# Patient Record
Sex: Female | Born: 1960 | Race: White | Hispanic: No | Smoking: Current every day smoker
Health system: Southern US, Community
[De-identification: ages and names within clinical notes are randomized; demographics above are authoritative.]

## PROBLEM LIST (undated history)

## (undated) DIAGNOSIS — I2510 Atherosclerotic heart disease of native coronary artery without angina pectoris: Secondary | ICD-10-CM

## (undated) DIAGNOSIS — R630 Anorexia: Secondary | ICD-10-CM

## (undated) DIAGNOSIS — E559 Vitamin D deficiency, unspecified: Secondary | ICD-10-CM

## (undated) DIAGNOSIS — F17200 Nicotine dependence, unspecified, uncomplicated: Secondary | ICD-10-CM

## (undated) DIAGNOSIS — K3184 Gastroparesis: Secondary | ICD-10-CM

## (undated) DIAGNOSIS — M8580 Other specified disorders of bone density and structure, unspecified site: Secondary | ICD-10-CM

## (undated) DIAGNOSIS — G4700 Insomnia, unspecified: Secondary | ICD-10-CM

## (undated) DIAGNOSIS — M1990 Unspecified osteoarthritis, unspecified site: Secondary | ICD-10-CM

## (undated) DIAGNOSIS — R634 Abnormal weight loss: Secondary | ICD-10-CM

## (undated) DIAGNOSIS — G43909 Migraine, unspecified, not intractable, without status migrainosus: Secondary | ICD-10-CM

## (undated) DIAGNOSIS — Z780 Asymptomatic menopausal state: Secondary | ICD-10-CM

## (undated) HISTORY — DX: Nicotine dependence, unspecified, uncomplicated: F17.200

## (undated) HISTORY — DX: Abnormal weight loss: R63.4

## (undated) HISTORY — DX: Insomnia, unspecified: G47.00

## (undated) HISTORY — DX: Unspecified osteoarthritis, unspecified site: M19.90

## (undated) HISTORY — DX: Other specified disorders of bone density and structure, unspecified site: M85.80

## (undated) HISTORY — DX: Asymptomatic menopausal state: Z78.0

## (undated) HISTORY — DX: Migraine, unspecified, not intractable, without status migrainosus: G43.909

## (undated) HISTORY — DX: Vitamin D deficiency, unspecified: E55.9

## (undated) HISTORY — DX: Atherosclerotic heart disease of native coronary artery without angina pectoris: I25.10

## (undated) HISTORY — PX: ABDOMINAL HYSTERECTOMY: SHX81

## (undated) HISTORY — DX: Anorexia: R63.0

## (undated) HISTORY — DX: Gastroparesis: K31.84

---

## 2004-11-04 ENCOUNTER — Ambulatory Visit (HOSPITAL_COMMUNITY): Admission: RE | Admit: 2004-11-04 | Discharge: 2004-11-04 | Payer: Self-pay | Admitting: Family Medicine

## 2008-01-22 ENCOUNTER — Ambulatory Visit: Payer: Self-pay | Admitting: Cardiology

## 2009-02-08 HISTORY — PX: COLONOSCOPY: SHX174

## 2012-04-03 ENCOUNTER — Other Ambulatory Visit (HOSPITAL_COMMUNITY): Payer: Self-pay | Admitting: Family Medicine

## 2012-04-03 DIAGNOSIS — I739 Peripheral vascular disease, unspecified: Secondary | ICD-10-CM

## 2012-04-04 ENCOUNTER — Other Ambulatory Visit (HOSPITAL_COMMUNITY): Payer: Self-pay | Admitting: Family Medicine

## 2012-04-04 ENCOUNTER — Ambulatory Visit (HOSPITAL_COMMUNITY)
Admission: RE | Admit: 2012-04-04 | Discharge: 2012-04-04 | Disposition: A | Discharge status: Home / Self Care | Payer: Medicaid Other | Source: Ambulatory Visit | Attending: Family Medicine | Admitting: Family Medicine

## 2012-04-04 DIAGNOSIS — M79609 Pain in unspecified limb: Secondary | ICD-10-CM | POA: Insufficient documentation

## 2012-04-04 DIAGNOSIS — I739 Peripheral vascular disease, unspecified: Secondary | ICD-10-CM

## 2012-05-30 ENCOUNTER — Ambulatory Visit (INDEPENDENT_AMBULATORY_CARE_PROVIDER_SITE_OTHER): Payer: Medicaid Other | Admitting: Family Medicine

## 2012-05-30 ENCOUNTER — Encounter: Payer: Self-pay | Admitting: Family Medicine

## 2012-05-30 VITALS — BP 105/73 | HR 78 | Temp 97.3°F | Ht 59.0 in | Wt 113.4 lb

## 2012-05-30 DIAGNOSIS — M1710 Unilateral primary osteoarthritis, unspecified knee: Secondary | ICD-10-CM

## 2012-05-30 DIAGNOSIS — R062 Wheezing: Secondary | ICD-10-CM

## 2012-05-30 DIAGNOSIS — M170 Bilateral primary osteoarthritis of knee: Secondary | ICD-10-CM

## 2012-05-30 DIAGNOSIS — F17200 Nicotine dependence, unspecified, uncomplicated: Secondary | ICD-10-CM

## 2012-05-30 NOTE — Progress Notes (Signed)
Patient ID: Susan Fischer, female   DOB: 05/24/1960, 52 y.o.   MRN: [id number] SUBJECTIVE: HPI: Here for follow up of bronchitis and wheezing has not had shortness of breath has not had wheezing has had a response to medications Medications used:albuterol inhaler has not had swelling of the feet has not had chest pain has had exposure tobacco smoke or other triggers.continues to smoke. has had a flu shot has not had a pneumonia shot has not had fever has not had purulent phlegm has not had blood in the sputum, has not had weight loss has not had any recent medication changes. Arthritis of the knees has been better. Doesn't take anything for the knee pain. Cutting back at smoking but not ready to stop.   PMH/PSH: reviewed/updated in Epic  SH/FH: reviewed/updated in Epic  Allergies: reviewed/updated in Epic  Medications: reviewed/updated in Epic  Immunizations: reviewed/updated in Epic  ROS: As above in the HPI. All other systems are stable or negative.  OBJECTIVE: APPEARANCE:  Patient in no acute distress.The patient appeared well nourished and normally developed. Acyanotic. Waist: VITAL SIGNS:BP 105/73  Pulse 78  Temp(Src) 97.3 F (36.3 C) (Oral)  Ht 4\' 11"  (1.499 m)  Wt 113 lb 6.4 oz (51.438 kg)  BMI 22.89 kg/m2   SKIN: warm and  Dry without overt rashes, tattoos and scars  HEAD and Neck: without JVD, Head and scalp: normal Eyes:No scleral icterus. Fundi normal, eye movements normal. Ears: Auricle normal, canal normal, Tympanic membranes normal, insufflation normal. Nose: normal Throat: normal Neck & thyroid: normal  CHEST & LUNGS: Chest wall: normal Lungs: coarse breath sounds, scattered rhonchi. Rare wheeze heard CVS: Reveals the PMI to be normally located. Regular rhythm, First and Second Heart sounds are normal,  absence of murmurs, rubs or gallops. Peripheral vasculature: Radial pulses: normal Dorsal pedis pulses: normal Posterior pulses:  normal  ABDOMEN:  Appearance: normal Benign,, no organomegaly, no masses, no Abdominal Aortic enlargement. No Guarding , no rebound. No Bruits. Bowel sounds: normal  RECTAL: N/A GU: N/A  EXTREMETIES: nonedematous. Both Femoral and Pedal pulses are normal.  MUSCULOSKELETAL:  Spine: normal Joints: intact  NEUROLOGIC: oriented to time,place and person; nonfocal. ASSESSMENT: Wheezing  Tobacco use disorder  Arthritis of both knees   PLAN:  No orders of the defined types were placed in this encounter.   No results found for this or any previous visit (from the past 24 hour(s)). Meds ordered this encounter  Medications  . zonisamide (ZONEGRAN) 100 MG capsule    Sig: Take 100 mg by mouth daily. TAKE 300MG  AT BEDTIME  . albuterol (PROVENTIL HFA;VENTOLIN HFA) 108 (90 BASE) MCG/ACT inhaler    Sig: Inhale 2 puffs into the lungs every 6 (six) hours as needed for wheezing.  counselled on smoking cessation. OTC tylenol prn knee arthritis pain. Consider PFTs. Patient didn't have time today. Suspect COPD.  RTc in 2 months.  Cassidy Tabet P. [place] Charon, M.D.

## 2012-05-31 ENCOUNTER — Encounter: Payer: Self-pay | Admitting: *Deleted

## 2012-07-24 ENCOUNTER — Ambulatory Visit (INDEPENDENT_AMBULATORY_CARE_PROVIDER_SITE_OTHER): Payer: BC Managed Care – PPO | Admitting: Family Medicine

## 2012-07-24 ENCOUNTER — Encounter: Payer: Self-pay | Admitting: Family Medicine

## 2012-07-24 VITALS — BP 115/69 | HR 74 | Temp 98.1°F | Wt 112.0 lb

## 2012-07-24 DIAGNOSIS — F17200 Nicotine dependence, unspecified, uncomplicated: Secondary | ICD-10-CM

## 2012-07-24 DIAGNOSIS — R062 Wheezing: Secondary | ICD-10-CM

## 2012-07-24 MED ORDER — ZONISAMIDE 100 MG PO CAPS: 100.0000 mg | ORAL_CAPSULE | Freq: Every day | ORAL | Status: DC

## 2012-07-24 NOTE — Progress Notes (Signed)
Patient ID: Susan Fischer, female   DOB: [birth year], 52 y.o.   MRN: [id number] SUBJECTIVE: CC: Chief Complaint  Patient presents with  . Follow-up    2 month follow up    HPI: Here for follow up of her smoking coughing and  Wheezing. Has been doing great now. No complaints. Came for PFTs as was recommended. No hemoptysis, no chest pain , no SOB.  PMH/PSH: reviewed/updated in Epic  SH/FH: reviewed/updated in Epic  Allergies: reviewed/updated in Epic  Medications: reviewed/updated in Epic  Immunizations: reviewed/updated in Epic  ROS: As above in the HPI. All other systems are stable or negative.  OBJECTIVE: APPEARANCE:  Patient in no acute distress.The patient appeared well nourished and normally developed. Acyanotic. Waist: VITAL SIGNS:BP 115/69  Pulse 74  Temp(Src) 98.1 F (36.7 C) (Oral)  Wt 112 lb (50.803 kg)  BMI 22.61 kg/m2 WF loolks well today  SKIN: warm and  Dry without overt rashes, tattoos no change in scars  HEAD and Neck: without JVD, Head and scalp: normal Eyes:No scleral icterus. Fundi normal, eye movements normal. Ears: Auricle normal, canal normal, Tympanic membranes normal, insufflation normal. Nose: normal Throat: normal Neck & thyroid: normal  CHEST & LUNGS: Chest wall: normal Lungs: coarse bs. No wheezes , no rales , no rhonchi. CVS: Reveals the PMI to be normally located. Regular rhythm, First and Second Heart sounds are normal,  absence of murmurs, rubs or gallops. Peripheral vasculature: Radial pulses: normal Dorsal pedis pulses: normal Posterior pulses: normal  ABDOMEN:  Appearance: normal Benign, no organomegaly, no masses, no Abdominal Aortic enlargement. No Guarding , no rebound. No Bruits. Bowel sounds: normal  RECTAL: N/A GU: N/A  EXTREMETIES: nonedematous. Both Femoral and Pedal pulses are normal.  MUSCULOSKELETAL:  Spine: normal Joints: crepitus of knees.  NEUROLOGIC: oriented to time,place and person;  nonfocal. Strength is normal Sensory is normal Reflexes are normal Cranial Nerves are normal.  ASSESSMENT: Wheezing  Tobacco use disorder   PLAN: PFTs reviewed scanned to chart, acceptable counselled on smoking ceassation. Reviewed PFTs with patienyt  .Return in about 4 months (around 11/23/2012) for Recheck medical problems.  Jlyn Cerros P. [place] Charon, M.D.

## 2012-07-25 DIAGNOSIS — R062 Wheezing: Secondary | ICD-10-CM | POA: Insufficient documentation

## 2012-07-25 DIAGNOSIS — F17200 Nicotine dependence, unspecified, uncomplicated: Secondary | ICD-10-CM | POA: Insufficient documentation

## 2012-08-01 ENCOUNTER — Ambulatory Visit: Payer: Medicaid Other | Admitting: Family Medicine

## 2012-11-24 ENCOUNTER — Ambulatory Visit (INDEPENDENT_AMBULATORY_CARE_PROVIDER_SITE_OTHER): Payer: BC Managed Care – PPO | Admitting: Family Medicine

## 2012-11-24 ENCOUNTER — Ambulatory Visit (INDEPENDENT_AMBULATORY_CARE_PROVIDER_SITE_OTHER): Payer: BC Managed Care – PPO

## 2012-11-24 ENCOUNTER — Encounter: Payer: Self-pay | Admitting: Family Medicine

## 2012-11-24 VITALS — BP 107/67 | HR 77 | Temp 97.8°F | Ht 61.0 in | Wt 102.4 lb

## 2012-11-24 DIAGNOSIS — Z780 Asymptomatic menopausal state: Secondary | ICD-10-CM | POA: Insufficient documentation

## 2012-11-24 DIAGNOSIS — G43909 Migraine, unspecified, not intractable, without status migrainosus: Secondary | ICD-10-CM | POA: Insufficient documentation

## 2012-11-24 DIAGNOSIS — M85851 Other specified disorders of bone density and structure, right thigh: Secondary | ICD-10-CM | POA: Insufficient documentation

## 2012-11-24 DIAGNOSIS — Z23 Encounter for immunization: Secondary | ICD-10-CM

## 2012-11-24 DIAGNOSIS — R634 Abnormal weight loss: Secondary | ICD-10-CM

## 2012-11-24 DIAGNOSIS — M1990 Unspecified osteoarthritis, unspecified site: Secondary | ICD-10-CM | POA: Insufficient documentation

## 2012-11-24 DIAGNOSIS — R059 Cough, unspecified: Secondary | ICD-10-CM

## 2012-11-24 DIAGNOSIS — M8580 Other specified disorders of bone density and structure, unspecified site: Secondary | ICD-10-CM

## 2012-11-24 DIAGNOSIS — M899 Disorder of bone, unspecified: Secondary | ICD-10-CM

## 2012-11-24 DIAGNOSIS — F17200 Nicotine dependence, unspecified, uncomplicated: Secondary | ICD-10-CM

## 2012-11-24 DIAGNOSIS — M129 Arthropathy, unspecified: Secondary | ICD-10-CM

## 2012-11-24 DIAGNOSIS — R05 Cough: Secondary | ICD-10-CM

## 2012-11-24 DIAGNOSIS — Z13220 Encounter for screening for lipoid disorders: Secondary | ICD-10-CM

## 2012-11-24 LAB — POCT CBC
Granulocyte percent: 79 %G (ref 37–80)
HCT, POC: 39.3 % (ref 37.7–47.9)
Hemoglobin: 13.5 g/dL (ref 12.2–16.2)
Lymph, poc: 1 (ref 0.6–3.4)
MCH, POC: 30.4 pg (ref 27–31.2)
MCHC: 34.4 g/dL (ref 31.8–35.4)
MCV: 88.5 fL (ref 80–97)
MPV: 8.6 fL (ref 0–99.8)
POC Granulocyte: 4.7 (ref 2–6.9)
POC LYMPH PERCENT: 16.7 %L (ref 10–50)
Platelet Count, POC: 179 10*3/uL (ref 142–424)
RBC: 4.4 M/uL (ref 4.04–5.48)
RDW, POC: 13.1 %
WBC: 6 10*3/uL (ref 4.6–10.2)

## 2012-11-24 NOTE — Patient Instructions (Signed)
Smoking Cessation Quitting smoking is important to your health and has many advantages. However, it is not always easy to quit since nicotine is a very addictive drug. Often times, people try 3 times or more before being able to quit. This document explains the best ways for you to prepare to quit smoking. Quitting takes hard work and a lot of effort, but you can do it. ADVANTAGES OF QUITTING SMOKING  You will live longer, feel better, and live better.  Your body will feel the impact of quitting smoking almost immediately.  Within 20 minutes, blood pressure decreases. Your pulse returns to its normal level.  After 8 hours, carbon monoxide levels in the blood return to normal. Your oxygen level increases.  After 24 hours, the chance of having a heart attack starts to decrease. Your breath, hair, and body stop smelling like smoke.  After 48 hours, damaged nerve endings begin to recover. Your sense of taste and smell improve.  After 72 hours, the body is virtually free of nicotine. Your bronchial tubes relax and breathing becomes easier.  After 2 to 12 weeks, lungs can hold more air. Exercise becomes easier and circulation improves.  The risk of having a heart attack, stroke, cancer, or lung disease is greatly reduced.  After 1 year, the risk of coronary heart disease is cut in half.  After 5 years, the risk of stroke falls to the same as a nonsmoker.  After 10 years, the risk of lung cancer is cut in half and the risk of other cancers decreases significantly.  After 15 years, the risk of coronary heart disease drops, usually to the level of a nonsmoker.  If you are pregnant, quitting smoking will improve your chances of having a healthy baby.  The people you live with, especially any children, will be healthier.  You will have extra money to spend on things other than cigarettes. QUESTIONS TO THINK ABOUT BEFORE ATTEMPTING TO QUIT You may want to talk about your answers with your  caregiver.  Why do you want to quit?  If you tried to quit in the past, what helped and what did not?  What will be the most difficult situations for you after you quit? How will you plan to handle them?  Who can help you through the tough times? Your family? Friends? A caregiver?  What pleasures do you get from smoking? What ways can you still get pleasure if you quit? Here are some questions to ask your caregiver:  How can you help me to be successful at quitting?  What medicine do you think would be best for me and how should I take it?  What should I do if I need more help?  What is smoking withdrawal like? How can I get information on withdrawal? GET READY  Set a quit date.  Change your environment by getting rid of all cigarettes, ashtrays, matches, and lighters in your home, car, or work. Do not let people smoke in your home.  Review your past attempts to quit. Think about what worked and what did not. GET SUPPORT AND ENCOURAGEMENT You have a better chance of being successful if you have help. You can get support in many ways.  Tell your family, friends, and co-workers that you are going to quit and need their support. Ask them not to smoke around you.  Get individual, group, or telephone counseling and support. Programs are available at local hospitals and health centers. Call your local health department for   information about programs in your area.  Spiritual beliefs and practices may help some smokers quit.  Download a "quit meter" on your computer to keep track of quit statistics, such as how long you have gone without smoking, cigarettes not smoked, and money saved.  Get a self-help book about quitting smoking and staying off of tobacco. LEARN NEW SKILLS AND BEHAVIORS  Distract yourself from urges to smoke. Talk to someone, go for a walk, or occupy your time with a task.  Change your normal routine. Take a different route to work. Drink tea instead of coffee.  Eat breakfast in a different place.  Reduce your stress. Take a hot bath, exercise, or read a book.  Plan something enjoyable to do every day. Reward yourself for not smoking.  Explore interactive web-based programs that specialize in helping you quit. GET MEDICINE AND USE IT CORRECTLY Medicines can help you stop smoking and decrease the urge to smoke. Combining medicine with the above behavioral methods and support can greatly increase your chances of successfully quitting smoking.  Nicotine replacement therapy helps deliver nicotine to your body without the negative effects and risks of smoking. Nicotine replacement therapy includes nicotine gum, lozenges, inhalers, nasal sprays, and skin patches. Some may be available over-the-counter and others require a prescription.  Antidepressant medicine helps people abstain from smoking, but how this works is unknown. This medicine is available by prescription.  Nicotinic receptor partial agonist medicine simulates the effect of nicotine in your brain. This medicine is available by prescription. Ask your caregiver for advice about which medicines to use and how to use them based on your health history. Your caregiver will tell you what side effects to look out for if you choose to be on a medicine or therapy. Carefully read the information on the package. Do not use any other product containing nicotine while using a nicotine replacement product.  RELAPSE OR DIFFICULT SITUATIONS Most relapses occur within the first 3 months after quitting. Do not be discouraged if you start smoking again. Remember, most people try several times before finally quitting. You may have symptoms of withdrawal because your body is used to nicotine. You may crave cigarettes, be irritable, feel very hungry, cough often, get headaches, or have difficulty concentrating. The withdrawal symptoms are only temporary. They are strongest when you first quit, but they will go away within  10 14 days. To reduce the chances of relapse, try to:  Avoid drinking alcohol. Drinking lowers your chances of successfully quitting.  Reduce the amount of caffeine you consume. Once you quit smoking, the amount of caffeine in your body increases and can give you symptoms, such as a rapid heartbeat, sweating, and anxiety.  Avoid smokers because they can make you want to smoke.  Do not let weight gain distract you. Many smokers will gain weight when they quit, usually less than 10 pounds. Eat a healthy diet and stay active. You can always lose the weight gained after you quit.  Find ways to improve your mood other than smoking. FOR MORE INFORMATION  www.smokefree.gov  Document Released: 01/19/2001 Document Revised: 07/27/2011 Document Reviewed: 05/06/2011 [employer] Patient Information 2014 [employer].  
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
## 2012-11-25 LAB — THYROID PANEL WITH TSH
Free Thyroxine Index: 1.5 (ref 1.2–4.9)
T3 Uptake Ratio: 26 % (ref 24–39)
T4, Total: 5.9 ug/dL (ref 4.5–12.0)
TSH: 2.91 u[IU]/mL (ref 0.450–4.500)

## 2012-11-25 LAB — CMP14+EGFR
ALT: 8 IU/L (ref 0–32)
AST: 14 IU/L (ref 0–40)
Albumin/Globulin Ratio: 2.4 (ref 1.1–2.5)
Albumin: 4.3 g/dL (ref 3.5–5.5)
Alkaline Phosphatase: 70 IU/L (ref 39–117)
BUN/Creatinine Ratio: 17 (ref 9–23)
BUN: 13 mg/dL (ref 6–24)
CO2: 21 mmol/L (ref 18–29)
Calcium: 9.3 mg/dL (ref 8.7–10.2)
Chloride: 102 mmol/L (ref 97–108)
Creatinine, Ser: 0.77 mg/dL (ref 0.57–1.00)
GFR calc Af Amer: 101 mL/min/{1.73_m2} (ref >59)
GFR calc non Af Amer: 88 mL/min/{1.73_m2} (ref >59)
Globulin, Total: 1.8 g/dL (ref 1.5–4.5)
Glucose: 81 mg/dL (ref 65–99)
Potassium: 4.3 mmol/L (ref 3.5–5.2)
Sodium: 143 mmol/L (ref 134–144)
Total Bilirubin: 0.2 mg/dL (ref 0.0–1.2)
Total Protein: 6.1 g/dL (ref 6.0–8.5)

## 2012-11-25 LAB — LIPID PANEL
Chol/HDL Ratio: 3 ratio units (ref 0.0–4.4)
Cholesterol, Total: 163 mg/dL (ref 100–199)
HDL: 54 mg/dL (ref >39)
LDL Calculated: 91 mg/dL (ref 0–99)
Triglycerides: 89 mg/dL (ref 0–149)
VLDL Cholesterol Cal: 18 mg/dL (ref 5–40)

## 2012-11-25 LAB — LIPASE: Lipase: 21 U/L (ref 0–59)

## 2012-11-25 LAB — AMYLASE: Amylase: 63 U/L (ref 31–124)

## 2012-11-25 NOTE — Progress Notes (Signed)
Quick Note:  Call patient. Labs normal. We did not get a Urine sample to check. She left and forgot to give [place] the urine. Otherwise, no change in plan. ______

## 2012-12-11 ENCOUNTER — Encounter: Payer: Self-pay | Admitting: Family Medicine

## 2012-12-11 ENCOUNTER — Ambulatory Visit (INDEPENDENT_AMBULATORY_CARE_PROVIDER_SITE_OTHER): Payer: BC Managed Care – PPO | Admitting: Family Medicine

## 2012-12-11 VITALS — BP 113/70 | HR 82 | Temp 97.0°F | Ht 63.0 in | Wt 103.6 lb

## 2012-12-11 DIAGNOSIS — M899 Disorder of bone, unspecified: Secondary | ICD-10-CM

## 2012-12-11 DIAGNOSIS — R634 Abnormal weight loss: Secondary | ICD-10-CM

## 2012-12-11 DIAGNOSIS — M8580 Other specified disorders of bone density and structure, unspecified site: Secondary | ICD-10-CM

## 2012-12-11 DIAGNOSIS — G43909 Migraine, unspecified, not intractable, without status migrainosus: Secondary | ICD-10-CM

## 2012-12-11 DIAGNOSIS — R630 Anorexia: Secondary | ICD-10-CM | POA: Insufficient documentation

## 2012-12-11 DIAGNOSIS — F17200 Nicotine dependence, unspecified, uncomplicated: Secondary | ICD-10-CM

## 2012-12-11 DIAGNOSIS — R300 Dysuria: Secondary | ICD-10-CM

## 2012-12-11 DIAGNOSIS — Z780 Asymptomatic menopausal state: Secondary | ICD-10-CM

## 2012-12-11 HISTORY — DX: Anorexia: R63.0

## 2012-12-11 LAB — POCT URINALYSIS DIPSTICK
Bilirubin, UA: NEGATIVE
Glucose, UA: NEGATIVE
Ketones, UA: NEGATIVE
Leukocytes, UA: NEGATIVE
Nitrite, UA: NEGATIVE
Protein, UA: NEGATIVE
Spec Grav, UA: 1.02
Urobilinogen, UA: NEGATIVE
pH, UA: 5

## 2012-12-11 LAB — POCT UA - MICROSCOPIC ONLY
Bacteria, U Microscopic: NEGATIVE
Casts, Ur, LPF, POC: NEGATIVE
Crystals, Ur, HPF, POC: NEGATIVE
Yeast, UA: NEGATIVE

## 2012-12-11 NOTE — Progress Notes (Signed)
SUBJECTIVE: CC: Chief Complaint  Patient presents with  . Follow-up    2 week follow up weight loss     HPI: Still losing weight. The appearance of weight gain today is not correct per patient. The last times  She had her shoes off and today has the on. And appears she gained a pound. Actually lost 2 pounds per patient. Has poor appetite. Denies depressive symptoms, see the depression module. No cough ., no night sweats,no melena. No pains. Feels fine. Her clothes is just falling off of her.  Past Medical History  Diagnosis Date  . Migraine   . Osteopenia   . Vitamin D deficiency   . Postmenopausal   . Tobacco use disorder   . Arthritis    No past surgical history on file. History   Social History  . Marital Status: Legally Separated    Spouse Name: N/A    Number of Children: N/A  . Years of Education: N/A   Occupational History  . Not on file.   Social History Main Topics  . Smoking status: Current Every Day Smoker -- 0.50 packs/day    Types: Cigarettes  . Smokeless tobacco: Not on file  . Alcohol Use: No  . Drug Use: No  . Sexual Activity: Not on file   Other Topics Concern  . Not on file   Social History Narrative  . No narrative on file   Family History  Problem Relation Age of Onset  . Cancer Father   . Heart attack Brother 88   Current Outpatient Prescriptions on File Prior to Visit  Medication Sig Dispense Refill  . albuterol (PROVENTIL HFA;VENTOLIN HFA) 108 (90 BASE) MCG/ACT inhaler Inhale 2 puffs into the lungs every 6 (six) hours as needed for wheezing.      Marland Kitchen zonisamide (ZONEGRAN) 100 MG capsule Take 1 capsule (100 mg total) by mouth daily. TAKE 300MG  AT BEDTIME  90 capsule  5   No current facility-administered medications on file prior to visit.   Allergies  Allergen Reactions  . Codeine   . Hydrocodone   . Robitussin Cough-Cold D [Pseudoephedrine-Dm-Gg]    Immunization History  Administered Date(s) Administered  . Influenza,inj,Quad  PF,36+ Mos 11/24/2012  . Tdap 12/10/2010   Prior to Admission medications   Medication Sig Start Date End Date Taking? Authorizing Provider  albuterol (PROVENTIL HFA;VENTOLIN HFA) 108 (90 BASE) MCG/ACT inhaler Inhale 2 puffs into the lungs every 6 (six) hours as needed for wheezing.   Yes Historical Provider, MD  zonisamide (ZONEGRAN) 100 MG capsule Take 1 capsule (100 mg total) by mouth daily. TAKE 300MG  AT BEDTIME 07/24/12  Yes Ileana Ladd, MD     ROS: As above in the HPI. All other systems are stable or negative.  OBJECTIVE: APPEARANCE:  Patient in no acute distress.The patient appeared well nourished and normally developed. Acyanotic. Waist: VITAL SIGNS:BP 113/70  Pulse 82  Temp(Src) 97 F (36.1 C) (Oral)  Ht 5\' 3"  (1.6 m)  Wt 103 lb 9.6 oz (46.993 kg)  BMI 18.36 kg/m2 WF with weight loss. Her pants waist is definitely larger.   SKIN: warm and  Dry without overt rashes, tattoos and scars  HEAD and Neck: without JVD, Head and scalp: normal Eyes:No scleral icterus. Fundi normal, eye movements normal. Ears: Auricle normal, canal normal, Tympanic membranes normal, insufflation normal. Nose: normal Throat: normal Neck & thyroid: normal  CHEST & LUNGS: Chest wall: normal Lungs: Clear  CVS: Reveals the PMI to be normally located.  Regular rhythm, First and Second Heart sounds are normal,  absence of murmurs, rubs or gallops. Peripheral vasculature: Radial pulses: normal Dorsal pedis pulses: normal Posterior pulses: normal  ABDOMEN:  Appearance: normal Benign, no organomegaly, no masses, no Abdominal Aortic enlargement. No Guarding , no rebound. No Bruits. Bowel sounds: normal  RECTAL: N/A GU: N/A  EXTREMETIES: nonedematous.  MUSCULOSKELETAL:  Spine: normal Joints: intact  NEUROLOGIC: oriented to time,place and person; nonfocal. Strength is normal Sensory is normal Reflexes are normal Cranial Nerves are normal.  Reviewed Chest Xray recently  negative.  ASSESSMENT: Dysuria - Plan: POCT urinalysis dipstick, POCT UA - Microscopic Only, Urine culture  Tobacco use disorder  Osteopenia  Postmenopausal - Plan: MM Digital Screening  Migraine  Weight loss - Plan: CT Abdomen Pelvis W Contrast, Ambulatory referral to Gastroenterology, Urine culture  Anorexia - Plan: CT Abdomen Pelvis W Contrast, Ambulatory referral to Gastroenterology  PLAN: Will embark on further work up to include GI consult even though she has had colonoscopy. Sh emay need EGD. Will order a Ct abdomen to r/o occult intraabdominal etiology.  Results for orders placed in visit on 12/11/12  POCT URINALYSIS DIPSTICK      Result Value Range   Color, UA gold     Clarity, UA clear     Glucose, UA negative     Bilirubin, UA negative     Ketones, UA negative     Spec Grav, UA 1.020     Blood, UA trace     pH, UA 5.0     Protein, UA negative     Urobilinogen, UA negative     Nitrite, UA negative     Leukocytes, UA Negative    POCT UA - MICROSCOPIC ONLY      Result Value Range   WBC, Ur, HPF, POC occ     RBC, urine, microscopic 1-3     Bacteria, U Microscopic negative     Mucus, UA few     Epithelial cells, urine per micros occ     Crystals, Ur, HPF, POC negative     Casts, Ur, LPF, POC negative     Yeast, UA negative      Orders Placed This Encounter  Procedures  . Urine culture  . MM Digital Screening    Standing Status: Future     Number of Occurrences:      Standing Expiration Date: 02/10/2014    Order Specific Question:  Reason for Exam (SYMPTOM  OR DIAGNOSIS REQUIRED)    Answer:  routine screen    Order Specific Question:  Is the patient pregnant?    Answer:  No    Order Specific Question:  Preferred imaging location?    Answer:  [facility]  . CT Abdomen Pelvis W Contrast    Standing Status: Future     Number of Occurrences:      Standing Expiration Date: 03/13/2014    Order Specific Question:  Reason for Exam (SYMPTOM  OR DIAGNOSIS  REQUIRED)    Answer:  unexplained weight loss and anorexia    Order Specific Question:  Is the patient pregnant?    Answer:  No    Order Specific Question:  Preferred imaging location?    Answer:  [facility]  . Ambulatory referral to Gastroenterology    Referral Priority:  Routine    Referral Type:  Consultation    Referral Reason:  Specialty Services Required    Requested Specialty:  Gastroenterology    Number of Visits  Requested:  1  . POCT urinalysis dipstick  . POCT UA - Microscopic Only  smoking cessation counselling.handout in AVS. 10 minutes counseling. Praised patient on her tobacco use reduction and hope she will achieve total cessation.   No orders of the defined types were placed in this encounter.   There are no discontinued medications. Return in about 4 weeks (around 01/08/2013) for Recheck medical problems.  Tosh Glaze P. Modesto Charon, M.D.

## 2012-12-11 NOTE — Patient Instructions (Signed)
Smoking Cessation Quitting smoking is important to your health and has many advantages. However, it is not always easy to quit since nicotine is a very addictive drug. Often times, people try 3 times or more before being able to quit. This document explains the best ways for you to prepare to quit smoking. Quitting takes hard work and a lot of effort, but you can do it. ADVANTAGES OF QUITTING SMOKING  You will live longer, feel better, and live better.  Your body will feel the impact of quitting smoking almost immediately.  Within 20 minutes, blood pressure decreases. Your pulse returns to its normal level.  After 8 hours, carbon monoxide levels in the blood return to normal. Your oxygen level increases.  After 24 hours, the chance of having a heart attack starts to decrease. Your breath, hair, and body stop smelling like smoke.  After 48 hours, damaged nerve endings begin to recover. Your sense of taste and smell improve.  After 72 hours, the body is virtually free of nicotine. Your bronchial tubes relax and breathing becomes easier.  After 2 to 12 weeks, lungs can hold more air. Exercise becomes easier and circulation improves.  The risk of having a heart attack, stroke, cancer, or lung disease is greatly reduced.  After 1 year, the risk of coronary heart disease is cut in half.  After 5 years, the risk of stroke falls to the same as a nonsmoker.  After 10 years, the risk of lung cancer is cut in half and the risk of other cancers decreases significantly.  After 15 years, the risk of coronary heart disease drops, usually to the level of a nonsmoker.  If you are pregnant, quitting smoking will improve your chances of having a healthy baby.  The people you live with, especially any children, will be healthier.  You will have extra money to spend on things other than cigarettes. QUESTIONS TO THINK ABOUT BEFORE ATTEMPTING TO QUIT You may want to talk about your answers with your  caregiver.  Why do you want to quit?  If you tried to quit in the past, what helped and what did not?  What will be the most difficult situations for you after you quit? How will you plan to handle them?  Who can help you through the tough times? Your family? Friends? A caregiver?  What pleasures do you get from smoking? What ways can you still get pleasure if you quit? Here are some questions to ask your caregiver:  How can you help me to be successful at quitting?  What medicine do you think would be best for me and how should I take it?  What should I do if I need more help?  What is smoking withdrawal like? How can I get information on withdrawal? GET READY  Set a quit date.  Change your environment by getting rid of all cigarettes, ashtrays, matches, and lighters in your home, car, or work. Do not let people smoke in your home.  Review your past attempts to quit. Think about what worked and what did not. GET SUPPORT AND ENCOURAGEMENT You have a better chance of being successful if you have help. You can get support in many ways.  Tell your family, friends, and co-workers that you are going to quit and need their support. Ask them not to smoke around you.  Get individual, group, or telephone counseling and support. Programs are available at local hospitals and health centers. Call your local health department for   information about programs in your area.  Spiritual beliefs and practices may help some smokers quit.  Download a "quit meter" on your computer to keep track of quit statistics, such as how long you have gone without smoking, cigarettes not smoked, and money saved.  Get a self-help book about quitting smoking and staying off of tobacco. LEARN NEW SKILLS AND BEHAVIORS  Distract yourself from urges to smoke. Talk to someone, go for a walk, or occupy your time with a task.  Change your normal routine. Take a different route to work. Drink tea instead of coffee.  Eat breakfast in a different place.  Reduce your stress. Take a hot bath, exercise, or read a book.  Plan something enjoyable to do every day. Reward yourself for not smoking.  Explore interactive web-based programs that specialize in helping you quit. GET MEDICINE AND USE IT CORRECTLY Medicines can help you stop smoking and decrease the urge to smoke. Combining medicine with the above behavioral methods and support can greatly increase your chances of successfully quitting smoking.  Nicotine replacement therapy helps deliver nicotine to your body without the negative effects and risks of smoking. Nicotine replacement therapy includes nicotine gum, lozenges, inhalers, nasal sprays, and skin patches. Some may be available over-the-counter and others require a prescription.  Antidepressant medicine helps people abstain from smoking, but how this works is unknown. This medicine is available by prescription.  Nicotinic receptor partial agonist medicine simulates the effect of nicotine in your brain. This medicine is available by prescription. Ask your caregiver for advice about which medicines to use and how to use them based on your health history. Your caregiver will tell you what side effects to look out for if you choose to be on a medicine or therapy. Carefully read the information on the package. Do not use any other product containing nicotine while using a nicotine replacement product.  RELAPSE OR DIFFICULT SITUATIONS Most relapses occur within the first 3 months after quitting. Do not be discouraged if you start smoking again. Remember, most people try several times before finally quitting. You may have symptoms of withdrawal because your body is used to nicotine. You may crave cigarettes, be irritable, feel very hungry, cough often, get headaches, or have difficulty concentrating. The withdrawal symptoms are only temporary. They are strongest when you first quit, but they will go away within  10 14 days. To reduce the chances of relapse, try to:  Avoid drinking alcohol. Drinking lowers your chances of successfully quitting.  Reduce the amount of caffeine you consume. Once you quit smoking, the amount of caffeine in your body increases and can give you symptoms, such as a rapid heartbeat, sweating, and anxiety.  Avoid smokers because they can make you want to smoke.  Do not let weight gain distract you. Many smokers will gain weight when they quit, usually less than 10 pounds. Eat a healthy diet and stay active. You can always lose the weight gained after you quit.  Find ways to improve your mood other than smoking. FOR MORE INFORMATION  www.smokefree.gov  Document Released: 01/19/2001 Document Revised: 07/27/2011 Document Reviewed: 05/06/2011 [employer] Patient Information 2014 [employer].  

## 2012-12-13 LAB — URINE CULTURE: Organism ID, Bacteria: NO GROWTH

## 2012-12-13 NOTE — Progress Notes (Signed)
Quick Note:  Call patient. Labs normal. UCx negative for infection No change in plan. ______

## 2012-12-14 ENCOUNTER — Ambulatory Visit (HOSPITAL_COMMUNITY)
Admission: RE | Admit: 2012-12-14 | Discharge: 2012-12-14 | Disposition: A | Discharge status: Home / Self Care | Payer: BC Managed Care – PPO | Source: Ambulatory Visit | Attending: Family Medicine | Admitting: Family Medicine

## 2012-12-14 DIAGNOSIS — Z87891 Personal history of nicotine dependence: Secondary | ICD-10-CM | POA: Insufficient documentation

## 2012-12-14 DIAGNOSIS — R933 Abnormal findings on diagnostic imaging of other parts of digestive tract: Secondary | ICD-10-CM | POA: Insufficient documentation

## 2012-12-14 DIAGNOSIS — R630 Anorexia: Secondary | ICD-10-CM | POA: Insufficient documentation

## 2012-12-14 DIAGNOSIS — Z780 Asymptomatic menopausal state: Secondary | ICD-10-CM

## 2012-12-14 DIAGNOSIS — R634 Abnormal weight loss: Secondary | ICD-10-CM | POA: Insufficient documentation

## 2012-12-14 DIAGNOSIS — Z1231 Encounter for screening mammogram for malignant neoplasm of breast: Secondary | ICD-10-CM | POA: Insufficient documentation

## 2012-12-14 MED ORDER — IOHEXOL 300 MG/ML  SOLN
100.0000 mL | Freq: Once | INTRAMUSCULAR | Status: AC | PRN
  Administered 2012-12-14: 100 mL via INTRAVENOUS

## 2012-12-14 NOTE — Progress Notes (Signed)
Quick Note:  Call Patient Labs abnormal: CT shows stomach wall thickening. May be normal but with her weight loss and poor apetite, she certainly warrants aEGD  Recommendations: Definitely needs to see the GI doctor. Please ensure referral expedited.   ______

## 2012-12-25 ENCOUNTER — Encounter: Payer: Self-pay | Admitting: Gastroenterology

## 2013-01-12 ENCOUNTER — Ambulatory Visit (INDEPENDENT_AMBULATORY_CARE_PROVIDER_SITE_OTHER): Payer: BC Managed Care – PPO | Admitting: Family Medicine

## 2013-01-12 ENCOUNTER — Encounter: Payer: Self-pay | Admitting: Family Medicine

## 2013-01-12 VITALS — BP 109/74 | HR 82 | Temp 98.1°F | Ht 59.0 in | Wt 102.4 lb

## 2013-01-12 DIAGNOSIS — R630 Anorexia: Secondary | ICD-10-CM

## 2013-01-12 DIAGNOSIS — Z780 Asymptomatic menopausal state: Secondary | ICD-10-CM

## 2013-01-12 DIAGNOSIS — G43909 Migraine, unspecified, not intractable, without status migrainosus: Secondary | ICD-10-CM

## 2013-01-12 DIAGNOSIS — F17200 Nicotine dependence, unspecified, uncomplicated: Secondary | ICD-10-CM

## 2013-01-12 DIAGNOSIS — R634 Abnormal weight loss: Secondary | ICD-10-CM

## 2013-01-12 DIAGNOSIS — M8580 Other specified disorders of bone density and structure, unspecified site: Secondary | ICD-10-CM

## 2013-01-12 DIAGNOSIS — M899 Disorder of bone, unspecified: Secondary | ICD-10-CM

## 2013-01-12 MED ORDER — ZONISAMIDE 100 MG PO CAPS: 100.0000 mg | ORAL_CAPSULE | Freq: Every day | ORAL | Status: DC
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
## 2013-01-19 ENCOUNTER — Encounter: Payer: Self-pay | Admitting: Gastroenterology

## 2013-01-19 ENCOUNTER — Telehealth: Payer: Self-pay

## 2013-01-19 ENCOUNTER — Ambulatory Visit (INDEPENDENT_AMBULATORY_CARE_PROVIDER_SITE_OTHER): Payer: BC Managed Care – PPO | Admitting: Gastroenterology

## 2013-01-19 VITALS — BP 90/60 | HR 72 | Ht 58.66 in | Wt 103.1 lb

## 2013-01-19 DIAGNOSIS — R630 Anorexia: Secondary | ICD-10-CM

## 2013-01-19 DIAGNOSIS — R933 Abnormal findings on diagnostic imaging of other parts of digestive tract: Secondary | ICD-10-CM

## 2013-01-19 DIAGNOSIS — R634 Abnormal weight loss: Secondary | ICD-10-CM

## 2013-01-19 MED ORDER — OMEPRAZOLE 20 MG PO CPDR: 20.0000 mg | DELAYED_RELEASE_CAPSULE | Freq: Every day | ORAL | Status: DC

## 2013-01-19 NOTE — Telephone Encounter (Signed)
Patient notified no need for repeat Colonscopy at this time and recall put in for 09/2019.

## 2013-01-19 NOTE — Telephone Encounter (Signed)
Message copied by Jessee Avers on Fri Jan 19, 2013  1:24 PM ------      Message from: Claudette Head T      Created: Fri Jan 19, 2013 12:44 PM       Please let the pt know about her colonoscopy in 09/2009. No need to repeat colonoscopy at this time unless she is heme + or if her weight loss doesn't improve. Please place a colonoscopy recall for 09/2019.  ------

## 2013-01-19 NOTE — Patient Instructions (Signed)
You have been scheduled for an endoscopy with propofol. Please follow written instructions given to you at your visit today. If you use inhalers (even only as needed), please bring them with you on the day of your procedure.  Please follow instructions on Hemoccult cards and mail them back to [place] when finished.   We have sent the following medications to your pharmacy for you to pick up at your convenience: Omeprazole.   Thank you for choosing me and [facility].  Venita Lick. Pleas Koch., MD., Clementeen Graham

## 2013-01-19 NOTE — Progress Notes (Addendum)
History of Present Illness: This is a 52 year old female who relates a decrease in appetite and early satiety beginning about 6 or 7 months ago. She subsequently had a 30 pound weight loss. She underwent CT scan of the abdomen and pelvis showing questionable proximal gastric wall thickening versus underdistention and no other abnormalities. Blood work obtained by Dr. Modesto Charon was unremarkable. She had a colonoscopy at [facility] however the date is not clear-one record indicates 2007 the other indicates 2011. Denie abdominal pain, constipation, diarrhea, change in stool caliber, melena, hematochezia, nausea, vomiting, dysphagia, reflux symptoms, chest pain.  Review of Systems: Pertinent positive and negative review of systems were noted in the above HPI section. All other review of systems were otherwise negative.  Allergies  Allergen Reactions  . Codeine   . Hydrocodone   . Robitussin Cough-Cold D [Pseudoephedrine-Dm-Gg]    Outpatient Prescriptions Prior to Visit  Medication Sig Dispense Refill  . albuterol (PROVENTIL HFA;VENTOLIN HFA) 108 (90 BASE) MCG/ACT inhaler Inhale 2 puffs into the lungs every 6 (six) hours as needed for wheezing.      Marland Kitchen zonisamide (ZONEGRAN) 100 MG capsule Take 1 capsule (100 mg total) by mouth daily. TAKE 300MG  AT BEDTIME  90 capsule  5   No facility-administered medications prior to visit.   Past Medical History  Diagnosis Date  . Migraine   . Osteopenia   . Vitamin D deficiency   . Postmenopausal   . Tobacco use disorder   . Arthritis   . Weight loss   . Insomnia    Past Surgical History  Procedure Laterality Date  . Colonoscopy  2007    [place]  . Abdominal hysterectomy     History   Social History  . Marital Status: Legally Separated    Spouse Name: N/A    Number of Children: 6  . Years of Education: N/A   Occupational History  . custodian    Social History Main Topics  . Smoking status: Current Every Day Smoker -- 0.50 packs/day   Types: Cigarettes  . Smokeless tobacco: Never Used  . Alcohol Use: No  . Drug Use: No  . Sexual Activity: None   Other Topics Concern  . None   Social History Narrative  . None   Family History  Problem Relation Age of Onset  . Cancer Father   . Heart attack Brother 24     Physical Exam: General: Well developed , well nourished, no acute distress Head: Normocephalic and atraumatic Eyes:  sclerae anicteric, EOMI Ears: Normal auditory acuity Mouth: No deformity or lesions Neck: Supple, no masses or thyromegaly Lungs: Clear throughout to auscultation Heart: Regular rate and rhythm; no murmurs, rubs or bruits Abdomen: Soft, non tender and non distended. No masses, hepatosplenomegaly or hernias noted. Normal Bowel sounds Musculoskeletal: Symmetrical with no gross deformities  Skin: No lesions on visible extremities Pulses:  Normal pulses noted Extremities: No clubbing, cyanosis, edema or deformities noted Neurological: Alert oriented x 4, grossly nonfocal Cervical Nodes:  No significant cervical adenopathy Inguinal Nodes: No significant inguinal adenopathy Psychological:  Alert and cooperative. Normal mood and affect  Assessment and Recommendations:  1. Anorexia, early satiety, weight loss, abnormal CT scan of the stomach. Rule out ulcer, gastritis and neoplasm. Begin omeprazole 20 mg daily, obtain stool Hemoccults. Begin the use of and Ensure 3 times a day. Schedule upper endoscopy. The risks, benefits, and alternatives to endoscopy with possible biopsy and possible dilation were discussed with the patient and they consent  to proceed. Attempt to obtain records from her prior colonoscopy in [place]. If her colonoscopy was fairly unremarkable in 2011 I do not feel we need to repeat colonoscopy at this time.    01/19/13 12:45 pm. Records received from [facility]. Ms. Olberding underwent colonoscopy by Dr. Allena Katz on 09/18/2009 showing mild sigmoid colon diverticulosis and a 10 year  screening colonoscopy was recommended.

## 2013-01-19 NOTE — Telephone Encounter (Signed)
Left a message for patient to return my call. 

## 2013-02-23 ENCOUNTER — Encounter: Payer: Self-pay | Admitting: Family Medicine

## 2013-02-23 ENCOUNTER — Ambulatory Visit (INDEPENDENT_AMBULATORY_CARE_PROVIDER_SITE_OTHER): Payer: BC Managed Care – PPO | Admitting: Family Medicine

## 2013-02-23 VITALS — BP 115/75 | HR 84 | Temp 98.5°F | Ht <= 58 in | Wt 103.6 lb

## 2013-02-23 DIAGNOSIS — M8580 Other specified disorders of bone density and structure, unspecified site: Secondary | ICD-10-CM

## 2013-02-23 DIAGNOSIS — R630 Anorexia: Secondary | ICD-10-CM

## 2013-02-23 DIAGNOSIS — Z780 Asymptomatic menopausal state: Secondary | ICD-10-CM

## 2013-02-23 DIAGNOSIS — M949 Disorder of cartilage, unspecified: Secondary | ICD-10-CM

## 2013-02-23 DIAGNOSIS — F17200 Nicotine dependence, unspecified, uncomplicated: Secondary | ICD-10-CM

## 2013-02-23 DIAGNOSIS — M899 Disorder of bone, unspecified: Secondary | ICD-10-CM

## 2013-02-23 DIAGNOSIS — R634 Abnormal weight loss: Secondary | ICD-10-CM

## 2013-02-23 NOTE — Patient Instructions (Signed)
Smoking Cessation Quitting smoking is important to your health and has many advantages. However, it is not always easy to quit since nicotine is a very addictive drug. Often times, people try 3 times or more before being able to quit. This document explains the best ways for you to prepare to quit smoking. Quitting takes hard work and a lot of effort, but you can do it. ADVANTAGES OF QUITTING SMOKING  You will live longer, feel better, and live better.  Your body will feel the impact of quitting smoking almost immediately.  Within 20 minutes, blood pressure decreases. Your pulse returns to its normal level.  After 8 hours, carbon monoxide levels in the blood return to normal. Your oxygen level increases.  After 24 hours, the chance of having a heart attack starts to decrease. Your breath, hair, and body stop smelling like smoke.  After 48 hours, damaged nerve endings begin to recover. Your sense of taste and smell improve.  After 72 hours, the body is virtually free of nicotine. Your bronchial tubes relax and breathing becomes easier.  After 2 to 12 weeks, lungs can hold more air. Exercise becomes easier and circulation improves.  The risk of having a heart attack, stroke, cancer, or lung disease is greatly reduced.  After 1 year, the risk of coronary heart disease is cut in half.  After 5 years, the risk of stroke falls to the same as a nonsmoker.  After 10 years, the risk of lung cancer is cut in half and the risk of other cancers decreases significantly.  After 15 years, the risk of coronary heart disease drops, usually to the level of a nonsmoker.  If you are pregnant, quitting smoking will improve your chances of having a healthy baby.  The people you live with, especially any children, will be healthier.  You will have extra money to spend on things other than cigarettes. QUESTIONS TO THINK ABOUT BEFORE ATTEMPTING TO QUIT You may want to talk about your answers with your  caregiver.  Why do you want to quit?  If you tried to quit in the past, what helped and what did not?  What will be the most difficult situations for you after you quit? How will you plan to handle them?  Who can help you through the tough times? Your family? Friends? A caregiver?  What pleasures do you get from smoking? What ways can you still get pleasure if you quit? Here are some questions to ask your caregiver:  How can you help me to be successful at quitting?  What medicine do you think would be best for me and how should I take it?  What should I do if I need more help?  What is smoking withdrawal like? How can I get information on withdrawal? GET READY  Set a quit date.  Change your environment by getting rid of all cigarettes, ashtrays, matches, and lighters in your home, car, or work. Do not let people smoke in your home.  Review your past attempts to quit. Think about what worked and what did not. GET SUPPORT AND ENCOURAGEMENT You have a better chance of being successful if you have help. You can get support in many ways.  Tell your family, friends, and co-workers that you are going to quit and need their support. Ask them not to smoke around you.  Get individual, group, or telephone counseling and support. Programs are available at local hospitals and health centers. Call your local health department for   information about programs in your area.  Spiritual beliefs and practices may help some smokers quit.  Download a "quit meter" on your computer to keep track of quit statistics, such as how long you have gone without smoking, cigarettes not smoked, and money saved.  Get a self-help book about quitting smoking and staying off of tobacco. LEARN NEW SKILLS AND BEHAVIORS  Distract yourself from urges to smoke. Talk to someone, go for a walk, or occupy your time with a task.  Change your normal routine. Take a different route to work. Drink tea instead of coffee.  Eat breakfast in a different place.  Reduce your stress. Take a hot bath, exercise, or read a book.  Plan something enjoyable to do every day. Reward yourself for not smoking.  Explore interactive web-based programs that specialize in helping you quit. GET MEDICINE AND USE IT CORRECTLY Medicines can help you stop smoking and decrease the urge to smoke. Combining medicine with the above behavioral methods and support can greatly increase your chances of successfully quitting smoking.  Nicotine replacement therapy helps deliver nicotine to your body without the negative effects and risks of smoking. Nicotine replacement therapy includes nicotine gum, lozenges, inhalers, nasal sprays, and skin patches. Some may be available over-the-counter and others require a prescription.  Antidepressant medicine helps people abstain from smoking, but how this works is unknown. This medicine is available by prescription.  Nicotinic receptor partial agonist medicine simulates the effect of nicotine in your brain. This medicine is available by prescription. Ask your caregiver for advice about which medicines to use and how to use them based on your health history. Your caregiver will tell you what side effects to look out for if you choose to be on a medicine or therapy. Carefully read the information on the package. Do not use any other product containing nicotine while using a nicotine replacement product.  RELAPSE OR DIFFICULT SITUATIONS Most relapses occur within the first 3 months after quitting. Do not be discouraged if you start smoking again. Remember, most people try several times before finally quitting. You may have symptoms of withdrawal because your body is used to nicotine. You may crave cigarettes, be irritable, feel very hungry, cough often, get headaches, or have difficulty concentrating. The withdrawal symptoms are only temporary. They are strongest when you first quit, but they will go away within  10 14 days. To reduce the chances of relapse, try to:  Avoid drinking alcohol. Drinking lowers your chances of successfully quitting.  Reduce the amount of caffeine you consume. Once you quit smoking, the amount of caffeine in your body increases and can give you symptoms, such as a rapid heartbeat, sweating, and anxiety.  Avoid smokers because they can make you want to smoke.  Do not let weight gain distract you. Many smokers will gain weight when they quit, usually less than 10 pounds. Eat a healthy diet and stay active. You can always lose the weight gained after you quit.  Find ways to improve your mood other than smoking. FOR MORE INFORMATION  www.smokefree.gov  Document Released: 01/19/2001 Document Revised: 07/27/2011 Document Reviewed: 05/06/2011 ExitCare Patient Information 2014 [employer].   Stress Management Stress is a state of physical or mental tension that often results from changes in your life or normal routine. Some common causes of stress are:  Death of a loved one.  Injuries or severe illnesses.  Getting fired or changing jobs.  Moving into a new home. Other causes may be:    Sexual problems.  Business or financial losses.  Taking on a large debt.  Regular conflict with someone at home or at work.  Constant tiredness from lack of sleep. It is not just bad things that are stressful. It may be stressful to:  Win the lottery.  Get married.  Buy a new car. The amount of stress that can be easily tolerated varies from person to person. Changes generally cause stress, regardless of the types of change. Too much stress can affect your health. It may lead to physical or emotional problems. Too little stress (boredom) may also become stressful. SUGGESTIONS TO REDUCE STRESS:  Talk things over with your family and friends. It often is helpful to share your concerns and worries. If you feel your problem is serious, you may want to get help from a  professional counselor.  Consider your problems one at a time instead of lumping them all together. Trying to take care of everything at once may seem impossible. List all the things you need to do and then start with the most important one. Set a goal to accomplish 2 or 3 things each day. If you expect to do too many in a single day you will naturally fail, causing you to feel even more stressed.  Do not use alcohol or drugs to relieve stress. Although you may feel better for a short time, they do not remove the problems that caused the stress. They can also be habit forming.  Exercise regularly - at least 3 times per week. Physical exercise can help to relieve that "uptight" feeling and will relax you.  The shortest distance between despair and hope is often a good night's sleep.  Go to bed and get up on time allowing yourself time for appointments without being rushed.  Take a short "time-out" period from any stressful situation that occurs during the day. Close your eyes and take some deep breaths. Starting with the muscles in your face, tense them, hold it for a few seconds, then relax. Repeat this with the muscles in your neck, shoulders, hand, stomach, back and legs.  Take good care of yourself. Eat a balanced diet and get plenty of rest.  Schedule time for having fun. Take a break from your daily routine to relax. HOME CARE INSTRUCTIONS   Call if you feel overwhelmed by your problems and feel you can no longer manage them on your own.  Return immediately if you feel like hurting yourself or someone else. Document Released: 07/21/2000 Document Revised: 04/19/2011 Document Reviewed: 09/19/2012 ExitCare Patient Information 2014 [employer].  

## 2013-02-23 NOTE — Progress Notes (Signed)
Patient ID: Susan Fischer, female   DOB: [birth year], 53 y.o.   MRN: [id number] SUBJECTIVE: CC: Chief Complaint  Patient presents with  . Follow-up    6 week folllow up states saw Dr Russella DarStark and was put on omperazole     HPI: Saw GI Dr Russella DarStark Planned EGD 03/20/2013 Still has poor apetite and has difficulty eating. Forced herself to eat. No pain. Stresses at home with her adopted kids.  Past Medical History  Diagnosis Date  . Migraine   . Osteopenia   . Vitamin D deficiency   . Postmenopausal   . Tobacco use disorder   . Arthritis   . Weight loss   . Insomnia    Past Surgical History  Procedure Laterality Date  . Abdominal hysterectomy    . Colonoscopy  2011    [place]   History   Social History  . Marital Status: Legally Separated    Spouse Name: N/A    Number of Children: 6  . Years of Education: N/A   Occupational History  . custodian    Social History Main Topics  . Smoking status: Current Every Day Smoker -- 0.50 packs/day    Types: Cigarettes  . Smokeless tobacco: Never Used  . Alcohol Use: No  . Drug Use: No  . Sexual Activity: Not on file   Other Topics Concern  . Not on file   Social History Narrative  . No narrative on file   Family History  Problem Relation Age of Onset  . Cancer Father   . Heart attack Brother [id number]   Current Outpatient Prescriptions on File Prior to Visit  Medication Sig Dispense Refill  . albuterol (PROVENTIL HFA;VENTOLIN HFA) 108 (90 BASE) MCG/ACT inhaler Inhale 2 puffs into the lungs every 6 (six) hours as needed for wheezing.      Marland Kitchen. omeprazole (PRILOSEC) 20 MG capsule Take 1 capsule (20 mg total) by mouth daily.  30 capsule  11  . zonisamide (ZONEGRAN) 100 MG capsule Take 1 capsule (100 mg total) by mouth daily. TAKE 300MG  AT BEDTIME  90 capsule  5   No current facility-administered medications on file prior to visit.   Allergies  Allergen Reactions  . Codeine   . Hydrocodone   . Robitussin Cough-Cold D  [Pseudoephedrine-Dm-Gg]    Immunization History  Administered Date(s) Administered  . Influenza,inj,Quad PF,36+ Mos 11/24/2012  . Tdap 12/10/2010   Prior to Admission medications   Medication Sig Start Date End Date Taking? Authorizing Provider  albuterol (PROVENTIL HFA;VENTOLIN HFA) 108 (90 BASE) MCG/ACT inhaler Inhale 2 puffs into the lungs every 6 (six) hours as needed for wheezing.   Yes Historical Provider, MD  omeprazole (PRILOSEC) 20 MG capsule Take 1 capsule (20 mg total) by mouth daily. 01/19/13  Yes Meryl DareMalcolm T Stark, MD  zonisamide (ZONEGRAN) 100 MG capsule Take 1 capsule (100 mg total) by mouth daily. TAKE 300MG  AT BEDTIME 01/12/13  Yes Ileana LaddFrancis P Munirah Doerner, MD     ROS: As above in the HPI. All other systems are stable or negative.  OBJECTIVE: APPEARANCE:  Patient in no acute distress.The patient appeared well nourished and normally developed. Acyanotic. Waist: VITAL SIGNS:BP 115/75  Pulse 84  Temp(Src) 98.5 F (36.9 C) (Oral)  Ht 4\' 10"  (1.473 m)  Wt 103 lb 9.6 oz (46.993 kg)  BMI 21.66 kg/m2  WF  SKIN: warm and  Dry without overt rashes, tattoos and scars  HEAD and Neck: without JVD, Head and scalp: normal Eyes:No scleral icterus.  Fundi normal, eye movements normal. Ears: Auricle normal, canal normal, Tympanic membranes normal, insufflation normal. Nose: normal Throat: normal Neck & thyroid: normal  CHEST & LUNGS: Chest wall: normal Lungs: Clear  CVS: Reveals the PMI to be normally located. Regular rhythm, First and Second Heart sounds are normal,  absence of murmurs, rubs or gallops. Peripheral vasculature: Radial pulses: normal Dorsal pedis pulses: normal Posterior pulses: normal  ABDOMEN:  Appearance: normal Benign, no organomegaly, no masses, no Abdominal Aortic enlargement. No Guarding , no rebound. No Bruits. Bowel sounds: normal  RECTAL: N/A GU: N/A  EXTREMETIES: nonedematous.  MUSCULOSKELETAL:  Spine: normal Joints:  intact  NEUROLOGIC: oriented to time,place and person; nonfocal. Strength is normal Sensory is normal Reflexes are normal Cranial Nerves are normal.  ASSESSMENT:  Weight loss  Tobacco use disorder  Anorexia  Postmenopausal  Osteopenia  Weight stable.still symptomatic.  PLAN:  Smoking cessation  Counseled for 7 minutes, especially in view of planned procedure. Health benefits discussed.  Await EGD  We discussed the possibility of a serious Gastric or GI problem.  We discussed stress management.  No orders of the defined types were placed in this encounter.   No orders of the defined types were placed in this encounter.   There are no discontinued medications. Return in about 2 months (around 04/23/2013), or if symptoms worsen or fail to improve, for Recheck medical problems.  Jazir Newey P. [place]esto Charon, M.D.

## 2013-03-20 ENCOUNTER — Encounter: Payer: Self-pay | Admitting: Gastroenterology

## 2013-03-20 ENCOUNTER — Telehealth: Payer: Self-pay

## 2013-03-20 ENCOUNTER — Ambulatory Visit (AMBULATORY_SURGERY_CENTER): Payer: BC Managed Care – PPO | Admitting: Gastroenterology

## 2013-03-20 VITALS — BP 135/84 | HR 67 | Temp 98.4°F | Resp 16 | Ht <= 58 in | Wt 103.0 lb

## 2013-03-20 DIAGNOSIS — R6881 Early satiety: Secondary | ICD-10-CM

## 2013-03-20 DIAGNOSIS — R634 Abnormal weight loss: Secondary | ICD-10-CM

## 2013-03-20 DIAGNOSIS — R933 Abnormal findings on diagnostic imaging of other parts of digestive tract: Secondary | ICD-10-CM

## 2013-03-20 MED ORDER — SODIUM CHLORIDE 0.9 % IV SOLN: 500.0000 mL | INTRAVENOUS | Status: DC

## 2013-03-20 NOTE — Telephone Encounter (Signed)
Per EGD report from 03/20/13 patient needs GES.   [facility] she is to arrive at 12:45 for 1:00 appointment She needs to be 6 hours NPO Left message for patient to call back to discuss

## 2013-03-20 NOTE — Telephone Encounter (Signed)
Patient notified of appt date and time

## 2013-03-20 NOTE — Patient Instructions (Signed)
YOU HAD AN ENDOSCOPIC PROCEDURE TODAY AT THE [facility]: Refer to the procedure report that was given to you for any specific questions about what was found during the examination.  If the procedure report does not answer your questions, please call your gastroenterologist to clarify.  If you requested that your care partner not be given the details of your procedure findings, then the procedure report has been included in a sealed envelope for you to review at your convenience later.  YOU SHOULD EXPECT: Some feelings of bloating in the abdomen. Passage of more gas than usual.  Walking can help get rid of the air that was put into your GI tract during the procedure and reduce the bloating. If you had a lower endoscopy (such as a colonoscopy or flexible sigmoidoscopy) you may notice spotting of blood in your stool or on the toilet paper. If you underwent a bowel prep for your procedure, then you may not have a normal bowel movement for a few days.  DIET: Your first meal following the procedure should be a light meal and then it is ok to progress to your normal diet.  A half-sandwich or bowl of soup is an example of a good first meal.  Heavy or fried foods are harder to digest and may make you feel nauseous or bloated.  Likewise meals heavy in dairy and vegetables can cause extra gas to form and this can also increase the bloating.  Drink plenty of fluids but you should avoid alcoholic beverages for 24 hours.  ACTIVITY: Your care partner should take you home directly after the procedure.  You should plan to take it easy, moving slowly for the rest of the day.  You can resume normal activity the day after the procedure however you should NOT DRIVE or use heavy machinery for 24 hours (because of the sedation medicines used during the test).    SYMPTOMS TO REPORT IMMEDIATELY: A gastroenterologist can be reached at any hour.  During normal business hours, 8:30 AM to 5:00 PM Monday through Friday,  call [phone].  After hours and on weekends, please call the GI answering service at [phone] who will take a message and have the physician on call contact you.  Following upper endoscopy (EGD)  Vomiting of blood or coffee ground material  New chest pain or pain under the shoulder blades  Painful or persistently difficult swallowing  New shortness of breath  Fever of 100F or higher  Black, tarry-looking stools  FOLLOW UP: If any biopsies were taken you will be contacted by phone or by letter within the next 1-3 weeks.  Call your gastroenterologist if you have not heard about the biopsies in 3 weeks.  Our staff will call the home number listed on your records the next business day following your procedure to check on you and address any questions or concerns that you may have at that time regarding the information given to you following your procedure. This is a courtesy call and so if there is no answer at the home number and we have not heard from you through the emergency physician on call, we will assume that you have returned to your regular daily activities without incident.  SIGNATURES/CONFIDENTIALITY: You and/or your care partner have signed paperwork which will be entered into your electronic medical record.  These signatures attest to the fact that that the information above on your After Visit Summary has been reviewed and is understood.  Full responsibility of   the confidentiality of this discharge information lies with you and/or your care-partner. 

## 2013-03-20 NOTE — Op Note (Signed)
[facility] [address] [place], [id number]   ENDOSCOPY PROCEDURE REPORT  PATIENT: Susan Fischer, Susan B.  MR#: [id number] BIRTHDATE: February 19, 1960 , 52  yrs. old GENDER: Female ENDOSCOPIST: Meryl DareMalcolm T Stark, MD, [facility] REFERRED BY:  Leodis SiasFrancis Wong, M.D. PROCEDURE DATE:  03/20/2013 PROCEDURE:  EGD, diagnostic ASA CLASS:     Class II INDICATIONS:  Weight loss.   abnormal CT of the GI tract.   early satiety. MEDICATIONS: MAC sedation, administered by CRNA and propofol (Diprivan) 100mg  IV TOPICAL ANESTHETIC: Cetacaine Spray DESCRIPTION OF PROCEDURE: After the risks benefits and alternatives of the procedure were thoroughly explained, informed consent was obtained.  The LB [id number] endoscope was introduced through the mouth and advanced to the second portion of the duodenum. Without limitations.  The instrument was slowly withdrawn as the mucosa was fully examined.    ESOPHAGUS: The mucosa of the esophagus appeared normal. STOMACH: The mucosa and folds of the stomach appeared normal. DUODENUM: The duodenal mucosa showed no abnormalities.  Retroflexed views revealed no abnormalities.     The scope was then withdrawn from the patient and the procedure completed.  COMPLICATIONS: There were no complications.  ENDOSCOPIC IMPRESSION: 1.   The EGD appeared normal  RECOMMENDATIONS: 1.  Call to schedule a follow-up appointment with primary MD within 2 weeks 2.  No GI cause for weight loss or early satiety noted 3.  Schedule GES   eSigned:  Meryl DareMalcolm T Stark, MD, [facility] 03/20/2013 10:50 AM

## 2013-03-21 ENCOUNTER — Telehealth: Payer: Self-pay | Admitting: *Deleted

## 2013-03-21 NOTE — Telephone Encounter (Signed)
  Follow up Call-  Call back number 03/20/2013  Post procedure Call Back phone  # [phone]  Permission to leave phone message Yes     Patient questions:  Do you have a fever, pain , or abdominal swelling? no Pain Score  0 *  Have you tolerated food without any problems? yes  Have you been able to return to your normal activities? yes  Do you have any questions about your discharge instructions: Diet   no Medications  no Follow up visit  no  Do you have questions or concerns about your Care? no  Actions: * If pain score is 4 or above: No action needed, pain <4.

## 2013-04-02 ENCOUNTER — Ambulatory Visit (HOSPITAL_COMMUNITY)
Admission: RE | Admit: 2013-04-02 | Discharge: 2013-04-02 | Disposition: A | Discharge status: Home / Self Care | Payer: BC Managed Care – PPO | Source: Ambulatory Visit | Attending: Gastroenterology | Admitting: Gastroenterology

## 2013-04-02 DIAGNOSIS — R6881 Early satiety: Secondary | ICD-10-CM | POA: Insufficient documentation

## 2013-04-02 DIAGNOSIS — R634 Abnormal weight loss: Secondary | ICD-10-CM

## 2013-04-02 DIAGNOSIS — R109 Unspecified abdominal pain: Secondary | ICD-10-CM | POA: Insufficient documentation

## 2013-04-02 MED ORDER — TECHNETIUM TC 99M SULFUR COLLOID
2.2000 | Freq: Once | INTRAVENOUS | Status: AC | PRN
  Administered 2013-04-02: 2.2 via INTRAVENOUS

## 2013-04-24 ENCOUNTER — Ambulatory Visit (INDEPENDENT_AMBULATORY_CARE_PROVIDER_SITE_OTHER): Payer: BC Managed Care – PPO | Admitting: Family Medicine

## 2013-04-24 ENCOUNTER — Encounter: Payer: Self-pay | Admitting: Family Medicine

## 2013-04-24 VITALS — BP 104/65 | HR 75 | Temp 98.8°F | Ht <= 58 in | Wt 101.0 lb

## 2013-04-24 DIAGNOSIS — Z780 Asymptomatic menopausal state: Secondary | ICD-10-CM

## 2013-04-24 DIAGNOSIS — M1990 Unspecified osteoarthritis, unspecified site: Secondary | ICD-10-CM

## 2013-04-24 DIAGNOSIS — R634 Abnormal weight loss: Secondary | ICD-10-CM

## 2013-04-24 DIAGNOSIS — M899 Disorder of bone, unspecified: Secondary | ICD-10-CM

## 2013-04-24 DIAGNOSIS — M8580 Other specified disorders of bone density and structure, unspecified site: Secondary | ICD-10-CM

## 2013-04-24 DIAGNOSIS — M129 Arthropathy, unspecified: Secondary | ICD-10-CM

## 2013-04-24 DIAGNOSIS — G43909 Migraine, unspecified, not intractable, without status migrainosus: Secondary | ICD-10-CM

## 2013-04-24 DIAGNOSIS — F17200 Nicotine dependence, unspecified, uncomplicated: Secondary | ICD-10-CM

## 2013-04-24 DIAGNOSIS — M949 Disorder of cartilage, unspecified: Secondary | ICD-10-CM

## 2013-04-24 DIAGNOSIS — K3184 Gastroparesis: Secondary | ICD-10-CM | POA: Insufficient documentation

## 2013-04-24 DIAGNOSIS — R630 Anorexia: Secondary | ICD-10-CM

## 2013-04-24 NOTE — Progress Notes (Signed)
Patient ID: Susan Fischer, female   DOB: 1960/12/18, 53 y.o.   MRN: [id number] SUBJECTIVE: CC: Chief Complaint  Patient presents with  . Follow-up    2 MONTH FOLLOW UP     HPI: Doing well. Weight stabilized at home. Anorexia and early satiety is better with the diet recommended by Dr Ailene RavelStarke. She has Gastroparesis. Diet better. Cutting down on smoking. Down to 7 cigarettes a day.   Past Medical History  Diagnosis Date  . Migraine   . Osteopenia   . Vitamin D deficiency   . Postmenopausal   . Tobacco use disorder   . Arthritis   . Weight loss   . Insomnia   . Gastroparesis    Past Surgical History  Procedure Laterality Date  . Abdominal hysterectomy    . Colonoscopy  2011    [place]   History   Social History  . Marital Status: Legally Separated    Spouse Name: N/A    Number of Children: 6  . Years of Education: N/A   Occupational History  . custodian    Social History Main Topics  . Smoking status: Current Every Day Smoker -- 0.50 packs/day    Types: Cigarettes  . Smokeless tobacco: Never Used  . Alcohol Use: No  . Drug Use: No  . Sexual Activity: Not on file   Other Topics Concern  . Not on file   Social History Narrative  . No narrative on file   Family History  Problem Relation Age of Onset  . Cancer Father   . Heart attack Brother 938   Current Outpatient Prescriptions on File Prior to Visit  Medication Sig Dispense Refill  . albuterol (PROVENTIL HFA;VENTOLIN HFA) 108 (90 BASE) MCG/ACT inhaler Inhale 2 puffs into the lungs every 6 (six) hours as needed for wheezing.      Marland Kitchen. zonisamide (ZONEGRAN) 100 MG capsule Take 1 capsule (100 mg total) by mouth daily. TAKE 300MG  AT BEDTIME  90 capsule  5  . omeprazole (PRILOSEC) 20 MG capsule Take 1 capsule (20 mg total) by mouth daily.  30 capsule  11   No current facility-administered medications on file prior to visit.   Allergies  Allergen Reactions  . Codeine   . Hydrocodone   . Robitussin Cough-Cold D  [Pseudoephedrine-Dm-Gg]    Immunization History  Administered Date(s) Administered  . Influenza,inj,Quad PF,36+ Mos 11/24/2012  . Tdap 12/10/2010   Prior to Admission medications   Medication Sig Start Date End Date Taking? Authorizing Provider  albuterol (PROVENTIL HFA;VENTOLIN HFA) 108 (90 BASE) MCG/ACT inhaler Inhale 2 puffs into the lungs every 6 (six) hours as needed for wheezing.   Yes Historical Provider, MD  zonisamide (ZONEGRAN) 100 MG capsule Take 1 capsule (100 mg total) by mouth daily. TAKE 300MG  AT BEDTIME 01/12/13  Yes Ileana LaddFrancis P Shantika Bermea, MD  omeprazole (PRILOSEC) 20 MG capsule Take 1 capsule (20 mg total) by mouth daily. 01/19/13   Meryl DareMalcolm T Stark, MD     ROS: As above in the HPI. All other systems are stable or negative.  OBJECTIVE: APPEARANCE:  Patient in no acute distress.The patient appeared well nourished and normally developed. Acyanotic. Waist: VITAL SIGNS:BP 104/65  Pulse 75  Temp(Src) 98.8 F (37.1 C) (Oral)  Ht 4\' 10"  (1.473 m)  Wt 101 lb (45.813 kg)  BMI 21.11 kg/m2 WF looks well.   SKIN: warm and  Dry without overt rashes, tattoos and scars  HEAD and Neck: without JVD, Head and scalp: normal Eyes:No  scleral icterus. Fundi normal, eye movements normal. Ears: Auricle normal, canal normal, Tympanic membranes normal, insufflation normal. Nose: normal Throat: normal Neck & thyroid: normal  CHEST & LUNGS: Chest wall: normal Lungs: Clear  CVS: Reveals the PMI to be normally located. Regular rhythm, First and Second Heart sounds are normal,  absence of murmurs, rubs or gallops. Peripheral vasculature: Radial pulses: normal Dorsal pedis pulses: normal Posterior pulses: normal  ABDOMEN:  Appearance: normal Benign, no organomegaly, no masses, no Abdominal Aortic enlargement. No Guarding , no rebound. No Bruits. Bowel sounds: normal  RECTAL: N/A GU: N/A  EXTREMETIES: nonedematous.  MUSCULOSKELETAL:  Spine: normal Joints:  intact  NEUROLOGIC: oriented to time,place and person; nonfocal. Strength is normal Sensory is normal Reflexes are normal Cranial Nerves are normal. Results for orders placed in visit on 12/11/12  URINE CULTURE      Result Value Ref Range   Urine Culture, Routine Final report     Result 1 No growth    POCT URINALYSIS DIPSTICK      Result Value Ref Range   Color, UA gold     Clarity, UA clear     Glucose, UA negative     Bilirubin, UA negative     Ketones, UA negative     Spec Grav, UA 1.020     Blood, UA trace     pH, UA 5.0     Protein, UA negative     Urobilinogen, UA negative     Nitrite, UA negative     Leukocytes, UA Negative    POCT UA - MICROSCOPIC ONLY      Result Value Ref Range   WBC, Ur, HPF, POC occ     RBC, urine, microscopic 1-3     Bacteria, U Microscopic negative     Mucus, UA few     Epithelial cells, urine per micros occ     Crystals, Ur, HPF, POC negative     Casts, Ur, LPF, POC negative     Yeast, UA negative      ASSESSMENT:  Weight loss  Tobacco use disorder  Anorexia  Arthritis  Gastroparesis  Osteopenia  Migraine  Postmenopausal  On diet for gastroparesis and doing much better. Working successfully to stop smoking. Doing better.  PLAN:  Continue present treatment plans and medications and special diet for gastroparesis.  Smoking cessation counselling for 5 minutes.  RTC in 6 months or prn.  No orders of the defined types were placed in this encounter.   No orders of the defined types were placed in this encounter.   There are no discontinued medications. Return if symptoms worsen or fail to improve.  Aideliz Garmany P. Modesto Charon, M.D.

## 2013-05-30 ENCOUNTER — Encounter: Payer: Self-pay | Admitting: Gastroenterology

## 2013-05-30 ENCOUNTER — Ambulatory Visit (INDEPENDENT_AMBULATORY_CARE_PROVIDER_SITE_OTHER): Payer: BC Managed Care – PPO | Admitting: Gastroenterology

## 2013-05-30 VITALS — BP 100/58 | HR 76 | Ht <= 58 in | Wt 103.2 lb

## 2013-05-30 DIAGNOSIS — K219 Gastro-esophageal reflux disease without esophagitis: Secondary | ICD-10-CM

## 2013-05-30 DIAGNOSIS — K3184 Gastroparesis: Secondary | ICD-10-CM

## 2013-05-30 NOTE — Patient Instructions (Signed)
Thank you for choosing me and [facility].  Malcolm T. Stark, Jr., MD., FACG  

## 2013-05-30 NOTE — Progress Notes (Signed)
    History of Present Illness: This is a 53 year old female with mild gastroparesis and GERD. She had an unexplained 7 pound weight loss. Her weight is stabilized at 103 pounds.   Current Medications, Allergies, Past Medical History, Past Surgical History, Family History and Social History were reviewed in [facility] electronic medical record.  Physical Exam: General: Well developed , well nourished, no acute distress Head: Normocephalic and atraumatic Eyes:  sclerae anicteric, EOMI Ears: Normal auditory acuity Mouth: No deformity or lesions Lungs: Clear throughout to auscultation Heart: Regular rate and rhythm; no murmurs, rubs or bruits Abdomen: Soft, non tender and non distended. No masses, hepatosplenomegaly or hernias noted. Normal Bowel sounds Musculoskeletal: Symmetrical with no gross deformities  Pulses:  Normal pulses noted Extremities: No clubbing, cyanosis, edema or deformities noted Neurological: Alert oriented x 4, grossly nonfocal Psychological:  Alert and cooperative. Normal mood and affect  Assessment and Recommendations:  1. Gastroparesis. Continue gastroparesis diet. Her mild weight loss might be secondary to gastroparesis. No other GI cause was found.   2. GERD. Continue omeprazole 20 mg qam.

## 2013-10-23 ENCOUNTER — Ambulatory Visit (INDEPENDENT_AMBULATORY_CARE_PROVIDER_SITE_OTHER): Payer: BC Managed Care – PPO

## 2013-10-23 ENCOUNTER — Encounter: Payer: Self-pay | Admitting: Family Medicine

## 2013-10-23 ENCOUNTER — Ambulatory Visit (INDEPENDENT_AMBULATORY_CARE_PROVIDER_SITE_OTHER): Payer: BC Managed Care – PPO | Admitting: Family Medicine

## 2013-10-23 VITALS — BP 107/66 | HR 70 | Temp 97.9°F | Ht <= 58 in | Wt 98.0 lb

## 2013-10-23 DIAGNOSIS — G8929 Other chronic pain: Secondary | ICD-10-CM

## 2013-10-23 DIAGNOSIS — M25539 Pain in unspecified wrist: Secondary | ICD-10-CM

## 2013-10-23 DIAGNOSIS — M25531 Pain in right wrist: Principal | ICD-10-CM

## 2013-10-23 MED ORDER — ZONISAMIDE 100 MG PO CAPS: 100.0000 mg | ORAL_CAPSULE | Freq: Every day | ORAL | Status: DC

## 2013-10-23 MED ORDER — ZONISAMIDE 100 MG PO CAPS: 300.0000 mg | ORAL_CAPSULE | Freq: Every day | ORAL | Status: DC

## 2013-10-23 MED ORDER — ALBUTEROL SULFATE HFA 108 (90 BASE) MCG/ACT IN AERS: 2.0000 | INHALATION_SPRAY | Freq: Four times a day (QID) | RESPIRATORY_TRACT | Status: DC | PRN

## 2013-10-23 NOTE — Progress Notes (Signed)
   Subjective:    Patient ID: Susan Fischer, female    DOB: [birth year], 53 y.o.   MRN: [id number]  HPI  53 year old female who is here to followup weight loss, migraine headache but today complains of pain in her right wrist. Migraine headaches seem to be controlled with some zonisamide. She uses albuterol for allergic related respiratory symptoms. She originally injured her right wrist about 2 years ago and then again several months ago picking up a crate of tomatoes. She has an over-the-counter splint that she wears. The pain is localized to the dorsum of her wrist in the midline. There are some paresthesias distal to that area.    Review of Systems  Constitutional: Negative.   HENT: Negative.   Eyes: Negative.   Respiratory: Negative.   Cardiovascular: Negative.   Gastrointestinal: Negative.   Endocrine: Negative.   Genitourinary: Negative.   Musculoskeletal: Positive for myalgias.       Bilateral thumb pain  Skin: Rash: not true rash but healing intertrigo.  Hematological: Negative.   Psychiatric/Behavioral: Negative.        Objective:   Physical Exam  Musculoskeletal:  Right wrist Tinel and Phalen's signs are negative There is a suggestion of a cyst or lump in the middle aspect of the wrist suggestive of a ganglion cyst    BP 107/66  Pulse 70  Temp(Src) 97.9 F (36.6 C) (Oral)  Ht  (1.473 m)  Wt 98 lb (44.453 kg)  BMI 20.49 kg/m2      Assessment & Plan:  1. Wrist pain, chronic, right X-ay neg but there is suggestion of ganglion cyst.  Will observe for now - DG Hand Complete Right; Future  Frederica Kuster MD

## 2013-10-23 NOTE — Addendum Note (Signed)
Addended by: Gwenith Daily on: 10/23/2013 02:29 PM   Modules accepted: Orders

## 2013-10-23 NOTE — Patient Instructions (Signed)

## 2014-02-13 ENCOUNTER — Telehealth: Payer: Self-pay | Admitting: *Deleted

## 2014-02-13 ENCOUNTER — Telehealth: Payer: Self-pay | Admitting: Family Medicine

## 2014-02-13 MED ORDER — ZONISAMIDE 100 MG PO CAPS: 300.0000 mg | ORAL_CAPSULE | Freq: Every day | ORAL | Status: DC

## 2014-02-13 NOTE — Telephone Encounter (Signed)
Aware , script was sent in. 

## 2014-02-13 NOTE — Telephone Encounter (Signed)
Patient last seen for chronic health recheck 04-24-13.  Last labwork was 11-24-12.  Please advise or send in refill for  zonisamide 100 mg , 3 capsules at hs.

## 2014-02-13 NOTE — Telephone Encounter (Signed)
Duplicate message. 

## 2016-03-01 ENCOUNTER — Encounter: Payer: Self-pay | Admitting: Family Medicine

## 2016-03-01 ENCOUNTER — Ambulatory Visit (INDEPENDENT_AMBULATORY_CARE_PROVIDER_SITE_OTHER): Payer: BLUE CROSS/BLUE SHIELD | Admitting: Family Medicine

## 2016-03-01 VITALS — BP 138/77 | HR 74 | Temp 97.0°F | Ht <= 58 in | Wt 117.8 lb

## 2016-03-01 DIAGNOSIS — M8580 Other specified disorders of bone density and structure, unspecified site: Secondary | ICD-10-CM | POA: Diagnosis not present

## 2016-03-01 DIAGNOSIS — F39 Unspecified mood [affective] disorder: Secondary | ICD-10-CM

## 2016-03-01 DIAGNOSIS — R5382 Chronic fatigue, unspecified: Secondary | ICD-10-CM | POA: Diagnosis not present

## 2016-03-01 DIAGNOSIS — F17200 Nicotine dependence, unspecified, uncomplicated: Secondary | ICD-10-CM

## 2016-03-01 MED ORDER — CITALOPRAM HYDROBROMIDE 20 MG PO TABS: 20.0000 mg | ORAL_TABLET | Freq: Every day | ORAL | 5 refills | Status: DC

## 2016-03-01 MED ORDER — ZONISAMIDE 100 MG PO CAPS: 300.0000 mg | ORAL_CAPSULE | Freq: Every day | ORAL | 5 refills | Status: DC

## 2016-03-01 MED ORDER — ALBUTEROL SULFATE HFA 108 (90 BASE) MCG/ACT IN AERS: 2.0000 | INHALATION_SPRAY | Freq: Four times a day (QID) | RESPIRATORY_TRACT | 2 refills | Status: DC | PRN

## 2016-03-01 NOTE — Patient Instructions (Signed)
Great to see you!   Start zonegran 1 capsule once daily for 2 weeks, then 2 capsules for 2 weeks, then 3 capsules daily.     Call 1800 Quit now   Start citalopram 1/2 pill once daily, increase to 1 pill once daily in 2 weeks.    We will call with labs within 1 week.   Come back in 3-4 weeks to follow up for mood.

## 2016-03-01 NOTE — Progress Notes (Signed)
HPI  Patient presents today here to discuss chronic medical problems.  Patient describes aneurysms in the brain and states that she was on Zonegran for this for quite some time, she would like to restart it. No history of seizure.  Patient states that she's had much family drama this year with her youngest son leaving home due to homosexuality and differences that they have, and her husband dying. She's having a difficult time sleeping and feels very down. He denies suicidal thoughts.  Patient does have severe chronic fatigue over the last several months.  She smoking a pack to 1-1/2 packs per day for many years, she's interested in quitting.   PMH: Smoking status noted ROS: Per HPI  Objective: BP 138/77   Pulse 74   Temp 97 F (36.1 C) (Oral)   Ht 4' 10" (1.473 m)   Wt 117 lb 12.8 oz (53.4 kg)   BMI 24.62 kg/m  Gen: NAD, alert, cooperative with exam HEENT: NCAT, oropharynx clear, nares clear, TMs normal bilaterally CV: RRR, good S1/S2, no murmur Resp: CTABL, no wheezes, non-labored Abd: SNTND, BS present, no guarding or organomegaly Ext: No edema, warm Neuro: Alert and oriented, No gross deficits  Depression screen PHQ 2/9 03/01/2016 12/11/2012  Decreased Interest 3 0  Down, Depressed, Hopeless 3 0  PHQ - 2 Score 6 0  Altered sleeping 3 -  Tired, decreased energy 3 -  Change in appetite 3 -  Feeling bad or failure about yourself  1 -  Trouble concentrating 3 -  Moving slowly or fidgety/restless 3 -  Suicidal thoughts 0 -  PHQ-9 Score 22 -     Assessment and plan:  # Mood disorder Mixed depression and anxiety Because of this I think Chantix and Wellbutrin are probably not good ideas. I believe Wellbutrin sometimes causes increased anxiety and the presence of anxiety. Starting citalopram, follow-up 3-4 weeks, discussed 10 mg daily 2 weeks then increase to 20 mg daily. Therapy recommended, psychologytoday.com for listing  # Osteopenia Acting vitamin  D Discuss DEXA scan next visit  # Chronic fatigue Checking thyroid and basic labs, likely mood related  tobacco use disorder No chantix or wellbutrin for now Discussed [patches, 1800 Quit now   ? Brain aneurysms Drug seizure prophylaxis with Zonegran long-term, patient states after stopping Zonegran about one year ago has had more headaches. She would like to restart. Starting 100 mg daily, titrate to increasing 100 mg every 2 weeks until reaches 300 mg daily as previous dose No imaging to support   Orders Placed This Encounter  Procedures  . Lipid panel  . CMP14+EGFR  . CBC with Differential/Platelet  . TSH  . VITAMIN D 25 Hydroxy (Vit-D Deficiency, Fractures)    Meds ordered this encounter  Medications  . albuterol (PROVENTIL HFA;VENTOLIN HFA) 108 (90 Base) MCG/ACT inhaler    Sig: Inhale 2 puffs into the lungs every 6 (six) hours as needed for wheezing.    Dispense:  1 Inhaler    Refill:  2  . zonisamide (ZONEGRAN) 100 MG capsule    Sig: Take 3 capsules (300 mg total) by mouth at bedtime.    Dispense:  90 capsule    Refill:  5  . citalopram (CELEXA) 20 MG tablet    Sig: Take 1 tablet (20 mg total) by mouth daily.    Dispense:  30 tablet    Refill:  5    Sam Bradshaw, MD [facility] 03/01/2016, 8:29 AM     

## 2016-03-02 ENCOUNTER — Other Ambulatory Visit: Payer: Self-pay | Admitting: Family Medicine

## 2016-03-02 DIAGNOSIS — E559 Vitamin D deficiency, unspecified: Secondary | ICD-10-CM | POA: Insufficient documentation

## 2016-03-02 LAB — CMP14+EGFR
ALT: 13 IU/L (ref 0–32)
AST: 20 IU/L (ref 0–40)
Albumin/Globulin Ratio: 2 (ref 1.2–2.2)
Albumin: 4.3 g/dL (ref 3.5–5.5)
Alkaline Phosphatase: 54 IU/L (ref 39–117)
BUN/Creatinine Ratio: 22 (ref 9–23)
BUN: 13 mg/dL (ref 6–24)
Bilirubin Total: 0.3 mg/dL (ref 0.0–1.2)
CO2: 24 mmol/L (ref 18–29)
CREATININE: 0.59 mg/dL (ref 0.57–1.00)
Calcium: 9.3 mg/dL (ref 8.7–10.2)
Chloride: 103 mmol/L (ref 96–106)
GFR calc Af Amer: 118 mL/min/{1.73_m2} (ref >59)
GFR calc non Af Amer: 102 mL/min/{1.73_m2} (ref >59)
GLUCOSE: 93 mg/dL (ref 65–99)
Globulin, Total: 2.2 g/dL (ref 1.5–4.5)
Potassium: 4.2 mmol/L (ref 3.5–5.2)
Sodium: 142 mmol/L (ref 134–144)
Total Protein: 6.5 g/dL (ref 6.0–8.5)

## 2016-03-02 LAB — CBC WITH DIFFERENTIAL/PLATELET
BASOS: 0 %
Basophils Absolute: 0 10*3/uL (ref 0.0–0.2)
EOS (ABSOLUTE): 0.1 10*3/uL (ref 0.0–0.4)
Eos: 1 %
HEMOGLOBIN: 14.9 g/dL (ref 11.1–15.9)
Hematocrit: 44.3 % (ref 34.0–46.6)
IMMATURE GRANS (ABS): 0 10*3/uL (ref 0.0–0.1)
Immature Granulocytes: 0 %
LYMPHS: 12 %
Lymphocytes Absolute: 1 10*3/uL (ref 0.7–3.1)
MCH: 30.8 pg (ref 26.6–33.0)
MCHC: 33.6 g/dL (ref 31.5–35.7)
MCV: 92 fL (ref 79–97)
Monocytes Absolute: 0.7 10*3/uL (ref 0.1–0.9)
Monocytes: 8 %
NEUTROS PCT: 79 %
Neutrophils Absolute: 6.3 10*3/uL (ref 1.4–7.0)
PLATELETS: 187 10*3/uL (ref 150–379)
RBC: 4.83 x10E6/uL (ref 3.77–5.28)
RDW: 13.1 % (ref 12.3–15.4)
WBC: 8.1 10*3/uL (ref 3.4–10.8)

## 2016-03-02 LAB — VITAMIN D 25 HYDROXY (VIT D DEFICIENCY, FRACTURES): Vit D, 25-Hydroxy: 16.8 ng/mL — ABNORMAL LOW (ref 30.0–100.0)

## 2016-03-02 LAB — LIPID PANEL
CHOL/HDL RATIO: 2.8 ratio (ref 0.0–4.4)
Cholesterol, Total: 166 mg/dL (ref 100–199)
HDL: 59 mg/dL (ref >39)
LDL CALC: 90 mg/dL (ref 0–99)
Triglycerides: 85 mg/dL (ref 0–149)
VLDL Cholesterol Cal: 17 mg/dL (ref 5–40)

## 2016-03-02 LAB — TSH: TSH: 2.97 u[IU]/mL (ref 0.450–4.500)

## 2016-03-02 MED ORDER — VITAMIN D (ERGOCALCIFEROL) 1.25 MG (50000 UNIT) PO CAPS: 50000.0000 [IU] | ORAL_CAPSULE | ORAL | 0 refills | Status: DC

## 2016-03-09 ENCOUNTER — Telehealth: Payer: Self-pay | Admitting: Family Medicine

## 2016-03-09 NOTE — Telephone Encounter (Signed)
Pt aware of lab results & Rx

## 2016-03-29 ENCOUNTER — Ambulatory Visit (INDEPENDENT_AMBULATORY_CARE_PROVIDER_SITE_OTHER): Payer: BLUE CROSS/BLUE SHIELD | Admitting: Family Medicine

## 2016-03-29 ENCOUNTER — Encounter: Payer: Self-pay | Admitting: Family Medicine

## 2016-03-29 VITALS — BP 124/75 | HR 73 | Temp 97.3°F | Ht <= 58 in | Wt 114.8 lb

## 2016-03-29 DIAGNOSIS — Z1231 Encounter for screening mammogram for malignant neoplasm of breast: Secondary | ICD-10-CM

## 2016-03-29 DIAGNOSIS — E559 Vitamin D deficiency, unspecified: Secondary | ICD-10-CM | POA: Diagnosis not present

## 2016-03-29 DIAGNOSIS — Z1239 Encounter for other screening for malignant neoplasm of breast: Secondary | ICD-10-CM

## 2016-03-29 DIAGNOSIS — F17200 Nicotine dependence, unspecified, uncomplicated: Secondary | ICD-10-CM | POA: Diagnosis not present

## 2016-03-29 DIAGNOSIS — E2839 Other primary ovarian failure: Secondary | ICD-10-CM

## 2016-03-29 DIAGNOSIS — F329 Major depressive disorder, single episode, unspecified: Secondary | ICD-10-CM

## 2016-03-29 DIAGNOSIS — F32A Depression, unspecified: Secondary | ICD-10-CM

## 2016-03-29 NOTE — Progress Notes (Signed)
   HPI  Patient presents today here to follow-up for anxiety and depression.  Patient explains that Celexa made her feel sluggish, she also had severe GI side effects. She stopped taking about one week ago with almost complete resolution of GI side effects. Denies any depression currently. He feels very well.  Patient is a smoker, considering quitting, however not ready.  Patient is willing to have a mammogram and DEXA scan.  Vitamin D Tolerating 50,000 units easily, understands to transition to 2000 units daily after that.   PMH: Smoking status noted ROS: Per HPI  Objective: BP 124/75   Pulse 73   Temp 97.3 F (36.3 C) (Oral)   Ht 4\' 10"  (1.473 m)   Wt 114 lb 12.8 oz (52.1 kg)   BMI 23.99 kg/m  Gen: NAD, alert, cooperative with exam HEENT: NCAT CV: RRR, good S1/S2, no murmur Resp: CTABL, no wheezes, non-labored Ext: No edema, warm Neuro: Alert and oriented, No gross deficits  Depression screen [facility] 2/9 03/29/2016 03/01/2016 12/11/2012  Decreased Interest 0 3 0  Down, Depressed, Hopeless 0 3 0  PHQ - 2 Score 0 6 0  Altered sleeping - 3 -  Tired, decreased energy - 3 -  Change in appetite - 3 -  Feeling bad or failure about yourself  - 1 -  Trouble concentrating - 3 -  Moving slowly or fidgety/restless - 3 -  Suicidal thoughts - 0 -  PHQ-9 Score - 22 -       Assessment and plan:  # Depression Doing very well, did not tolerate Celexa Apparently episodic depression, patient has made a complete turnaround from last visit. No medications, monitor  # Screening, breast cancer screening, estrogen deficiency Postmenopausal, vitamin D deficiency-DEXA scan ordered Mammogram ordered  # Tobacco use disorder Recommended cessation, she's not ready  # Vitamin D deficiency Tolerating replacement easily, understands transition to 2000 units daily     Orders Placed This Encounter  Procedures  . MM Digital Screening    Standing Status:   Future    Standing  Expiration Date:   05/27/2017    Scheduling Instructions:     [facility]    Order Specific Question:   Reason for Exam (SYMPTOM  OR DIAGNOSIS REQUIRED)    Answer:   screening    Order Specific Question:   Is the patient pregnant?    Answer:   No    Order Specific Question:   Preferred imaging location?    Answer:   External  . DG [facility] DEXA    Order Specific Question:   Reason for Exam (SYMPTOM  OR DIAGNOSIS REQUIRED)    Answer:   screening    Order Specific Question:   Is the patient pregnant?    Answer:   No    [name], MD [facility] 03/29/2016, 9:23 AM

## 2016-03-29 NOTE — Patient Instructions (Signed)
Great to see you!  Lets follow up on 4-6 months unless you need us sooner.

## 2016-04-05 ENCOUNTER — Telehealth: Payer: Self-pay | Admitting: Family Medicine

## 2016-04-05 NOTE — Telephone Encounter (Signed)
Can you please schedule?

## 2016-04-05 NOTE — Telephone Encounter (Signed)
Scheduled and letter mailed with appointment date/time

## 2016-04-06 ENCOUNTER — Other Ambulatory Visit: Payer: BLUE CROSS/BLUE SHIELD

## 2016-04-07 ENCOUNTER — Telehealth: Payer: Self-pay

## 2016-04-07 NOTE — Telephone Encounter (Signed)
Patient has Dexa appointment 06/27/16.

## 2016-04-07 NOTE — Telephone Encounter (Signed)
-----   Message from Elenora GammaSamuel L Bradshaw, MD sent at 04/07/2016  9:09 AM EST ----- Will you see if she will schedule this?  Thanks! sam ----- Message ----- From: SYSTEM Sent: 04/03/2016  12:06 AM To: Elenora GammaSamuel L Bradshaw, MD

## 2016-06-23 ENCOUNTER — Telehealth: Payer: Self-pay | Admitting: Family Medicine

## 2016-06-23 DIAGNOSIS — R079 Chest pain, unspecified: Secondary | ICD-10-CM | POA: Diagnosis not present

## 2016-06-23 DIAGNOSIS — R5383 Other fatigue: Secondary | ICD-10-CM | POA: Diagnosis not present

## 2016-06-23 DIAGNOSIS — R0602 Shortness of breath: Secondary | ICD-10-CM | POA: Diagnosis not present

## 2016-06-23 DIAGNOSIS — R0600 Dyspnea, unspecified: Secondary | ICD-10-CM | POA: Diagnosis not present

## 2016-06-24 NOTE — Telephone Encounter (Signed)
Pt notified needs to be evaluated before appt can be scheduled appt scheduled for eval with PCP

## 2016-06-25 ENCOUNTER — Encounter: Payer: Self-pay | Admitting: Family Medicine

## 2016-06-25 ENCOUNTER — Ambulatory Visit (INDEPENDENT_AMBULATORY_CARE_PROVIDER_SITE_OTHER): Payer: BLUE CROSS/BLUE SHIELD | Admitting: Family Medicine

## 2016-06-25 VITALS — BP 145/90 | HR 88 | Temp 98.2°F | Ht <= 58 in | Wt 105.4 lb

## 2016-06-25 DIAGNOSIS — R079 Chest pain, unspecified: Secondary | ICD-10-CM | POA: Diagnosis not present

## 2016-06-25 DIAGNOSIS — R0602 Shortness of breath: Secondary | ICD-10-CM
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
## 2016-06-25 NOTE — Patient Instructions (Signed)
Great to see you!  Take a daily aspirin daily.    Nonspecific Chest Pain Chest pain can be caused by many different conditions. There is a chance that your pain could be related to something serious, such as a heart attack or a blood clot in your lungs. Chest pain can also be caused by conditions that are not life-threatening. If you have chest pain, it is very important to follow up with your doctor. Follow these instructions at home: Medicines   If you were prescribed an antibiotic medicine, take it as told by your doctor. Do not stop taking the antibiotic even if you start to feel better.  Take over-the-counter and prescription medicines only as told by your doctor. Lifestyle   Do not use any products that contain nicotine or tobacco, such as cigarettes and e-cigarettes. If you need help quitting, ask your doctor.  Do not drink alcohol.  Make lifestyle changes as told by your doctor. These may include:  Getting regular exercise. Ask your doctor for some activities that are safe for you.  Eating a heart-healthy diet. A diet specialist (dietitian) can help you to learn healthy eating options.  Staying at a healthy weight.  Managing diabetes, if needed.  Lowering your stress, as with deep breathing or spending time in nature. General instructions   Avoid any activities that make you feel chest pain.  If your chest pain is because of heartburn:  Raise (elevate) the head of your bed about 6 inches (15 cm). You can do this by putting blocks under the bed legs at the head of the bed.  Do not sleep with extra pillows under your head. That does not help heartburn.  Keep all follow-up visits as told by your doctor. This is important. This includes any further testing if your chest pain does not go away. Contact a doctor if:  Your chest pain does not go away.  You have a rash with blisters on your chest.  You have a fever.  You have chills. Get help right away if:  Your  chest pain is worse.  You have a cough that gets worse, or you cough up blood.  You have very bad (severe) pain in your belly (abdomen).  You are very weak.  You pass out (faint).  You have either of these for no clear reason:  Sudden chest discomfort.  Sudden discomfort in your arms, back, neck, or jaw.  You have shortness of breath at any time.  You suddenly start to sweat, or your skin gets clammy.  You feel sick to your stomach (nauseous).  You throw up (vomit).  You suddenly feel light-headed or dizzy.  Your heart starts to beat fast, or it feels like it is skipping beats. These symptoms may be an emergency. Do not wait to see if the symptoms will go away. Get medical help right away. Call your local emergency services (911 in the [place] Do not drive yourself to the hospital. This information is not intended to replace advice given to you by your health care provider. Make sure you discuss any questions you have with your health care provider. Document Released: 07/14/2007 Document Revised: 10/20/2015 Document Reviewed: 10/20/2015 Elsevier Interactive Patient Education  2017 [employer]

## 2016-06-26 LAB — CBC WITH DIFFERENTIAL/PLATELET
BASOS ABS: 0 10*3/uL (ref 0.0–0.2)
Basos: 0 %
EOS (ABSOLUTE): 0.1 10*3/uL (ref 0.0–0.4)
Eos: 2 %
Hematocrit: 39.3 % (ref 34.0–46.6)
Hemoglobin: 13.1 g/dL (ref 11.1–15.9)
IMMATURE GRANS (ABS): 0 10*3/uL (ref 0.0–0.1)
Immature Granulocytes: 0 %
LYMPHS: 25 %
Lymphocytes Absolute: 1.8 10*3/uL (ref 0.7–3.1)
MCH: 30.7 pg (ref 26.6–33.0)
MCHC: 33.3 g/dL (ref 31.5–35.7)
MCV: 92 fL (ref 79–97)
MONOS ABS: 0.6 10*3/uL (ref 0.1–0.9)
Monocytes: 9 %
NEUTROS ABS: 4.6 10*3/uL (ref 1.4–7.0)
Neutrophils: 64 %
PLATELETS: 195 10*3/uL (ref 150–379)
RBC: 4.27 x10E6/uL (ref 3.77–5.28)
RDW: 13.4 % (ref 12.3–15.4)
WBC: 7.1 10*3/uL (ref 3.4–10.8)

## 2016-06-26 LAB — CMP14+EGFR
A/G RATIO: 2 (ref 1.2–2.2)
ALT: 9 IU/L (ref 0–32)
AST: 19 IU/L (ref 0–40)
Albumin: 4.4 g/dL (ref 3.5–5.5)
Alkaline Phosphatase: 58 IU/L (ref 39–117)
BUN/Creatinine Ratio: 17 (ref 9–23)
BUN: 12 mg/dL (ref 6–24)
CHLORIDE: 105 mmol/L (ref 96–106)
CO2: 24 mmol/L (ref 18–29)
Calcium: 9.4 mg/dL (ref 8.7–10.2)
Creatinine, Ser: 0.71 mg/dL (ref 0.57–1.00)
GFR calc non Af Amer: 94 mL/min/{1.73_m2} (ref >59)
GFR, EST AFRICAN AMERICAN: 109 mL/min/{1.73_m2} (ref >59)
GLOBULIN, TOTAL: 2.2 g/dL (ref 1.5–4.5)
Glucose: 88 mg/dL (ref 65–99)
POTASSIUM: 4.2 mmol/L (ref 3.5–5.2)
SODIUM: 140 mmol/L (ref 134–144)
Total Protein: 6.6 g/dL (ref 6.0–8.5)

## 2016-06-26 LAB — LIPID PANEL
CHOL/HDL RATIO: 3 ratio (ref 0.0–4.4)
CHOLESTEROL TOTAL: 160 mg/dL (ref 100–199)
HDL: 53 mg/dL (ref >39)
LDL Calculated: 84 mg/dL (ref 0–99)
TRIGLYCERIDES: 114 mg/dL (ref 0–149)
VLDL Cholesterol Cal: 23 mg/dL (ref 5–40)

## 2016-06-26 LAB — TSH: TSH: 3.33 u[IU]/mL (ref 0.450–4.500)

## 2016-06-28 ENCOUNTER — Telehealth: Payer: Self-pay | Admitting: Family Medicine

## 2016-06-29 ENCOUNTER — Telehealth (HOSPITAL_COMMUNITY): Payer: Self-pay | Admitting: *Deleted

## 2016-06-29 NOTE — Telephone Encounter (Signed)
Patient given detailed instructions per Myocardial Perfusion Study Information Sheet for the test on 07/02/16 at 0815. Patient notified to arrive 15 minutes early and that it is imperative to arrive on time for appointment to keep from having the test rescheduled.  If you need to cancel or reschedule your appointment, please call the office within 24 hours of your appointment. . Patient verbalized understanding.Hildred Pharo, Adelene IdlerCynthia W

## 2016-06-29 NOTE — Telephone Encounter (Signed)
Patient aware of lab work

## 2016-07-01 ENCOUNTER — Telehealth (HOSPITAL_COMMUNITY): Payer: Self-pay | Admitting: Family Medicine

## 2016-07-01 NOTE — Telephone Encounter (Signed)
Called pt and lmsg infoming her to her myoview appt for tomorrow would be cancelled due to lack of clinical information needed to insurance company.

## 2016-07-01 NOTE — Telephone Encounter (Signed)
According to the OV of 06/25/16, the pt was to be seen by your office within a week and have this test done also. Her appointment is not until 08/06/16. Is there any way she can be seen earlier and then possible documentation would be available for insurance purposes?

## 2016-07-02 ENCOUNTER — Ambulatory Visit (HOSPITAL_COMMUNITY): Payer: BLUE CROSS/BLUE SHIELD

## 2016-07-07 ENCOUNTER — Encounter: Payer: BLUE CROSS/BLUE SHIELD | Admitting: *Deleted

## 2016-07-07 ENCOUNTER — Ambulatory Visit (INDEPENDENT_AMBULATORY_CARE_PROVIDER_SITE_OTHER): Payer: BLUE CROSS/BLUE SHIELD

## 2016-07-07 DIAGNOSIS — Z780 Asymptomatic menopausal state: Secondary | ICD-10-CM | POA: Diagnosis not present

## 2016-07-07 DIAGNOSIS — Z1231 Encounter for screening mammogram for malignant neoplasm of breast: Secondary | ICD-10-CM | POA: Diagnosis not present

## 2016-07-08 ENCOUNTER — Telehealth: Payer: Self-pay | Admitting: Family Medicine

## 2016-07-08 NOTE — Telephone Encounter (Signed)
Pt aware of results and scheduled with Tammy 07/27/16 at 3:30.

## 2016-07-09 DIAGNOSIS — I2510 Atherosclerotic heart disease of native coronary artery without angina pectoris: Secondary | ICD-10-CM

## 2016-07-09 HISTORY — DX: Atherosclerotic heart disease of native coronary artery without angina pectoris: I25.10

## 2016-07-16 ENCOUNTER — Encounter: Payer: Self-pay | Admitting: Physician Assistant

## 2016-07-16 ENCOUNTER — Ambulatory Visit (INDEPENDENT_AMBULATORY_CARE_PROVIDER_SITE_OTHER): Payer: BLUE CROSS/BLUE SHIELD | Admitting: Physician Assistant

## 2016-07-16 VITALS — BP 110/70 | HR 77 | Ht <= 58 in | Wt 102.0 lb

## 2016-07-16 DIAGNOSIS — R0609 Other forms of dyspnea: Secondary | ICD-10-CM

## 2016-07-16 DIAGNOSIS — Z01810 Encounter for preprocedural cardiovascular examination: Secondary | ICD-10-CM

## 2016-07-16 DIAGNOSIS — R079 Chest pain, unspecified: Secondary | ICD-10-CM

## 2016-07-16 DIAGNOSIS — R0600 Dyspnea, unspecified: Secondary | ICD-10-CM

## 2016-07-16 DIAGNOSIS — R002 Palpitations: Secondary | ICD-10-CM | POA: Diagnosis not present

## 2016-07-16 MED ORDER — BISOPROLOL FUMARATE 5 MG PO TABS: 2.5000 mg | ORAL_TABLET | Freq: Every day | ORAL | 3 refills | Status: DC

## 2016-07-16 NOTE — Patient Instructions (Signed)
Medication Instructions:   START Bisoprolol 2.5mg  daily.  Labwork:  Pre-cath labs today in office   Testing/Procedures:    [facility] [address] [place] [address] Dept: [phone] Loc: [phone]  [name]  07/16/2016  You are scheduled for a Cardiac Catheterization on Wednesday, June 13 at 11:30am with Dr. Bryan Lemmaavid Harding.  1. Please arrive [facility] the [facility] at [facility]: [address] [place], [place] [address] at 9:30 AM (two hours before your procedure to ensure your preparation). Free valet parking service is available.   Special note: Every effort is made to have your procedure done on time. Please understand that emergencies sometimes delay scheduled procedures.  2. Diet: Do not eat or drink anything after midnight prior to your procedure except sips of water to take medications.  3. Labs: Today  4. Medication instructions in preparation for your procedure:    On the morning of your procedure, take your Aspirin and your regular morning medicines.  You may use sips of water.  5. Plan for one night stay--bring personal belongings. 6. Bring a current list of your medications and current insurance cards. 7. You MUST have a responsible person to drive you home. 8. Someone MUST be with you the first 24 hours after you arrive home or your discharge will be delayed. 9. Please wear clothes that are easy to get on and off and wear slip-on shoes.  Thank you for allowing us to care for you!   -- [place] [facility]   Follow-Up:  After heart catheterization   If you need a refill on your cardiac medications before your next appointment, please call your pharmacy.

## 2016-07-16 NOTE — Progress Notes (Signed)
Cardiology Office Note    Date:  07/17/2016   ID:  Susan Fischer, DOB 03-30-1960, MRN [id number]  PCP:  Elenora Gamma, MD  Cardiologist:  new - patient seen with Dr. Herbie Baltimore  Chief Complaint  Patient presents with  . New Patient (Initial Visit)    chest pain X 1 month, frequent shortness of breath, ocassional edema in hands, occassional pain in legs, frequent lightheaded or dizziness within the past week   Patient referred for cardiology evaluation of chest pain at the request of Dr. Ermalinda Memos.   History of Present Illness:  Susan Fischer is a 56 y.o. female without significant cardiac history who is being referred by Dr. Ermalinda Memos her primary care provider for evaluation of exertional shortness of breath and chest pain. She has significant FHx of early CAD with her brother having first MI in his 30s and eventually had CABG. She herself has never had any cardiac issues. She has been smoking heavily since she was 12, and continue to smoke to this day. She does not have a prior diagnosis of COPD. She is accompanied by her employer today. She works by picking tomatoes. She says for the past 4 weeks, she is been having worsening dyspnea on exertion. She also described a tightness in her chest associated with exertion but can also occur at rest as well. The frequency of the symptom is worsening. She also described a sharp jabbing pain that lasted only a second at a time this symptom does not symptom to occur with exertion. She denies any exacerbating factors such as deep inspiration, body rotation or palpation.  I discussed her symptom and risk factors with Dr. Herbie Baltimore. Her sharp stabbing pain is atypical, however her dyspnea with exertion and chest pressure associated with exertion is very concerning. I recommended an echocardiogram to establish baseline. We have also discussed with her regarding stress testing versus cardiac catheterization. Given her significant family history, concerning  symptom, chronic smoking, we recommended cardiac catheterization for definitive evaluation. Risk and benefit of the procedure has been discussed with the patient, we plan for cardiac catheterization next week. She was started on 81 mg aspirin and low-dose beta blocker. She does have nightly episode of palpitation as well, low-dose beta blocker should help. Once we assess coronary artery, then we'll decide whether she will start on Lipitor as well.   Past Medical History:  Diagnosis Date  . Arthritis   . Gastroparesis   . Insomnia   . Migraine   . Osteopenia   . Postmenopausal   . Tobacco use disorder   . Vitamin D deficiency   . Weight loss     Past Surgical History:  Procedure Laterality Date  . ABDOMINAL HYSTERECTOMY    . COLONOSCOPY  2011   [place]    Current Medications: Outpatient Medications Prior to Visit  Medication Sig Dispense Refill  . albuterol (PROVENTIL HFA;VENTOLIN HFA) 108 (90 Base) MCG/ACT inhaler Inhale 2 puffs into the lungs every 6 (six) hours as needed for wheezing. 1 Inhaler 2  . zonisamide (ZONEGRAN) 100 MG capsule Take 3 capsules (300 mg total) by mouth at bedtime. (Patient taking differently: Take 300 mg by mouth daily. ) 90 capsule 5  . Vitamin D, Ergocalciferol, (DRISDOL) 50000 units CAPS capsule Take 1 capsule (50,000 Units total) by mouth every 7 (seven) days. (Patient not taking: Reported on 07/16/2016) 12 capsule 0   No facility-administered medications prior to visit.      Allergies:   Codeine; Hydrocodone;  and Robitussin cough-cold d [pseudoephedrine-[id number]]   Social History   Social History  . Marital status: Legally Separated    Spouse name: N/A  . Number of children: 6  . 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 Smoking status: Current Every Day Smoker    Packs/day: 0.50    Types: Cigarettes  . Smokeless tobacco: Never Used     Comment: form given 05-30-13  .  Alcohol use No  . Drug use: No  . Sexual activity: Not Asked   Other Topics Concern  . None   Social History Narrative  . None     Family History:  The patient's family history includes Breast cancer in her paternal grandmother; Diabetes in her maternal grandfather and mother; Heart attack in her brother and sister; Heart attack (age of onset: 2838) in her brother; Hypertension in her son and son; Lung cancer in her father; Other in her brother and sister; Skin cancer in her maternal grandmother.   ROS:   Please see the history of present illness.    ROS All other systems reviewed and are negative.   PHYSICAL EXAM:   VS:  BP 110/70   Pulse 77   Ht 4\' 10"  (1.473 m)   Wt 102 lb (46.3 kg)   BMI 21.32 kg/m    GEN: Well nourished, well developed, in no acute distress  HEENT: normal  Neck: no JVD, carotid bruits, or masses Cardiac: RRR; no murmurs, rubs, or gallops,no edema  Respiratory:  clear to auscultation bilaterally, normal work of breathing GI: soft, nontender, nondistended, + BS MS: no deformity or atrophy  Skin: warm and dry, no rash Neuro:  Alert and Oriented x 3, Strength and sensation are intact Psych: euthymic mood, full affect  Wt Readings from Last 3 Encounters:  07/16/16 102 lb (46.3 kg)  06/25/16 105 lb 6.4 oz (47.8 kg)  03/29/16 114 lb 12.8 oz (52.1 kg)      Studies/Labs Reviewed:   EKG:  EKG is ordered today.  The ekg ordered today demonstrates Normal sinus rhythm without significant ST-T wave changes.  Recent Labs: 06/25/2016: ALT 9; TSH 3.330 07/16/2016: BUN 12; Creatinine, Ser 0.77; Hemoglobin 14.1; Platelets 206; Potassium 4.1; Sodium 141   Lipid Panel    Component Value Date/Time   CHOL 160 06/25/2016 1644   TRIG 114 06/25/2016 1644   HDL 53 06/25/2016 1644   CHOLHDL 3.0 06/25/2016 1644   LDLCALC 84 06/25/2016 1644    Additional studies/ records that were reviewed today include:   Record send by Dr. Ermalinda MemosBradshaw, her primary care  provider   ASSESSMENT:    1. Chest pain, unspecified type   2. Preprocedural cardiovascular examination   3. Palpitation   4. DOE (dyspnea on exertion)      PLAN:  In order of problems listed above:  1. Chest pain occurs both with exertion and at rest: Occur more often with exertion. Also developed significant dyspnea on exertion for the past 4 weeks as well. Despite today's EKG showed no obvious sign of ischemia, her symptom is very concerning. She also have prolonged history of tobacco abuse and family history of early CAD. I have discussed the case with Dr. Herbie BaltimoreHarding, we discussed various options including stress testing versus cardiac catheterization, eventually we recommended cardiac catheterization for definitive assessment. We will also obtain an echocardiogram as well given her dyspnea on exertion. Some of her dyspnea  on exertion is likely related to chronic tobacco abuse, if no significant coronary artery disease is found during cath, may need referral to pulmonology service for PFT evaluation. Start ASA daily  - Risk and benefit of procedure explained to the patient who display clear understanding and agree to proceed.  Discussed with patient possible procedural risk include bleeding, vascular injury, renal injury, arrythmia, MI, stroke and loss of limb or life.  - no obvious contraindication to DES if needed  2. Palpitation: We will start on low-dose bisoprolol 2.5 mg daily, this should help with palpitation as well. We need to monitor for arrhythmia once the patient is in the hospital.    Medication Adjustments/Labs and Tests Ordered: Current medicines are reviewed at length with the patient today.  Concerns regarding medicines are outlined above.  Medication changes, Labs and Tests ordered today are listed in the Patient Instructions below. Patient Instructions  Medication Instructions:   START Bisoprolol 2.5mg  daily.  Labwork:  Pre-cath labs today in  office   Testing/Procedures:    [facility] [address] [place] [address] Dept: [phone] Loc: [phone]  Susan Fischer  07/16/2016  You are scheduled for a Cardiac Catheterization on Wednesday, June 13 at 11:30am with Dr. Bryan Lemma.  1. Please arrive at the [facility] (Main Entrance A) at [facility]: [address] [place], [place] [address] at 9:30 AM (two hours before your procedure to ensure your preparation). Free valet parking service is available.   Special note: Every effort is made to have your procedure done on time. Please understand that emergencies sometimes delay scheduled procedures.  2. Diet: Do not eat or drink anything after midnight prior to your procedure except sips of water to take medications.  3. Labs: Today  4. Medication instructions in preparation for your procedure:    On the morning of your procedure, take your Aspirin and your regular morning medicines.  You may use sips of water.  5. Plan for one night stay--bring personal belongings. 6. Bring a current list of your medications and current insurance cards. 7. You MUST have a responsible person to drive you home. 8. Someone MUST be with you the first 24 hours after you arrive home or your discharge will be delayed. 9. Please wear clothes that are easy to get on and off and wear slip-on shoes.  Thank you for allowing [place] to care for you!   -- [place] [facility]   Follow-Up:  After heart catheterization   If you need a refill on your cardiac medications before your next appointment, please call your pharmacy.      Ramond Dial, [place]  07/17/2016 11:50 AM    [facility] [address] [place], [place], [place]  [address] Phone: [phone]; Fax: [phone]

## 2016-07-17 ENCOUNTER — Encounter: Payer: Self-pay | Admitting: Physician Assistant

## 2016-07-17 ENCOUNTER — Other Ambulatory Visit: Payer: Self-pay | Admitting: Physician Assistant

## 2016-07-17 LAB — BASIC METABOLIC PANEL
BUN/Creatinine Ratio: 16 (ref 9–23)
BUN: 12 mg/dL (ref 6–24)
CHLORIDE: 104 mmol/L (ref 96–106)
CO2: 22 mmol/L (ref 18–29)
CREATININE: 0.77 mg/dL (ref 0.57–1.00)
Calcium: 9.5 mg/dL (ref 8.7–10.2)
GFR calc Af Amer: 98 mL/min/{1.73_m2} (ref >59)
GFR calc non Af Amer: 85 mL/min/{1.73_m2} (ref >59)
GLUCOSE: 81 mg/dL (ref 65–99)
POTASSIUM: 4.1 mmol/L (ref 3.5–5.2)
SODIUM: 141 mmol/L (ref 134–144)

## 2016-07-17 LAB — CBC
HEMOGLOBIN: 14.1 g/dL (ref 11.1–15.9)
Hematocrit: 42.8 % (ref 34.0–46.6)
MCH: 31.1 pg (ref 26.6–33.0)
MCHC: 32.9 g/dL (ref 31.5–35.7)
MCV: 95 fL (ref 79–97)
PLATELETS: 206 10*3/uL (ref 150–379)
RBC: 4.53 x10E6/uL (ref 3.77–5.28)
RDW: 12.8 % (ref 12.3–15.4)
WBC: 5.6 10*3/uL (ref 3.4–10.8)

## 2016-07-17 LAB — PROTIME-INR
INR: 1 (ref 0.8–1.2)
PROTHROMBIN TIME: 10.4 s (ref 9.1–12.0)

## 2016-07-17 NOTE — Addendum Note (Signed)
Addended byLisabeth Devoid: Chyanna Flock on: 07/17/2016 11:50 AM   Modules accepted: Level of Service

## 2016-07-17 NOTE — Progress Notes (Signed)
White blood cell count, red blood cell count, platelet, kidney function and electrolyte all normal. No contraindication to upcoming procedure.

## 2016-07-20 ENCOUNTER — Telehealth: Payer: Self-pay

## 2016-07-20 ENCOUNTER — Other Ambulatory Visit: Payer: Self-pay | Admitting: Physician Assistant

## 2016-07-20 NOTE — Telephone Encounter (Signed)
Call placed to Pt to verify pre cath instruction.  Call went to VM.  Left this nurse name and # for call back if any questions.

## 2016-07-21 ENCOUNTER — Encounter (HOSPITAL_COMMUNITY)
Admission: RE | Disposition: A | Discharge status: Home / Self Care | Payer: Self-pay | Source: Ambulatory Visit | Attending: Cardiology

## 2016-07-21 ENCOUNTER — Ambulatory Visit (HOSPITAL_COMMUNITY)
Admission: RE | Admit: 2016-07-21 | Discharge: 2016-07-21 | Disposition: A | Discharge status: Home / Self Care | Payer: BLUE CROSS/BLUE SHIELD | Source: Ambulatory Visit | Attending: Cardiology | Admitting: Cardiology

## 2016-07-21 DIAGNOSIS — F17210 Nicotine dependence, cigarettes, uncomplicated: Secondary | ICD-10-CM | POA: Diagnosis not present

## 2016-07-21 DIAGNOSIS — Z8249 Family history of ischemic heart disease and other diseases of the circulatory system: Secondary | ICD-10-CM | POA: Insufficient documentation

## 2016-07-21 DIAGNOSIS — G4700 Insomnia, unspecified: Secondary | ICD-10-CM | POA: Insufficient documentation

## 2016-07-21 DIAGNOSIS — I25110 Atherosclerotic heart disease of native coronary artery with unstable angina pectoris: Secondary | ICD-10-CM | POA: Diagnosis not present

## 2016-07-21 DIAGNOSIS — E559 Vitamin D deficiency, unspecified: Secondary | ICD-10-CM | POA: Insufficient documentation

## 2016-07-21 DIAGNOSIS — M1990 Unspecified osteoarthritis, unspecified site: Secondary | ICD-10-CM | POA: Diagnosis not present

## 2016-07-21 DIAGNOSIS — I200 Unstable angina: Secondary | ICD-10-CM | POA: Diagnosis present

## 2016-07-21 DIAGNOSIS — R0609 Other forms of dyspnea: Secondary | ICD-10-CM | POA: Diagnosis not present

## 2016-07-21 DIAGNOSIS — M8580 Other specified disorders of bone density and structure, unspecified site: Secondary | ICD-10-CM | POA: Insufficient documentation

## 2016-07-21 DIAGNOSIS — R002 Palpitations: Secondary | ICD-10-CM | POA: Diagnosis not present

## 2016-07-21 DIAGNOSIS — R0600 Dyspnea, unspecified: Secondary | ICD-10-CM | POA: Diagnosis present

## 2016-07-21 DIAGNOSIS — K3184 Gastroparesis: Secondary | ICD-10-CM | POA: Insufficient documentation

## 2016-07-21 DIAGNOSIS — G43909 Migraine, unspecified, not intractable, without status migrainosus: Secondary | ICD-10-CM | POA: Insufficient documentation

## 2016-07-21 DIAGNOSIS — I2584 Coronary atherosclerosis due to calcified coronary lesion: Secondary | ICD-10-CM | POA: Diagnosis not present

## 2016-07-21 DIAGNOSIS — I2510 Atherosclerotic heart disease of native coronary artery without angina pectoris: Secondary | ICD-10-CM

## 2016-07-21 HISTORY — PX: INTRAVASCULAR PRESSURE WIRE/FFR STUDY: CATH118243

## 2016-07-21 HISTORY — PX: LEFT HEART CATH AND CORONARY ANGIOGRAPHY: CATH118249

## 2016-07-21 LAB — POCT ACTIVATED CLOTTING TIME: Activated Clotting Time: 241 seconds

## 2016-07-21 SURGERY — LEFT HEART CATH AND CORONARY ANGIOGRAPHY
Anesthesia: LOCAL

## 2016-07-21 MED ORDER — HEPARIN (PORCINE) IN NACL 2-0.9 UNIT/ML-% IJ SOLN
INTRAMUSCULAR | Status: AC | PRN
  Administered 2016-07-21: 1000 mL

## 2016-07-21 MED ORDER — MIDAZOLAM HCL 2 MG/2ML IJ SOLN
INTRAMUSCULAR | Status: AC
  Filled 2016-07-21: qty 2

## 2016-07-21 MED ORDER — SODIUM CHLORIDE 0.9% FLUSH: 3.0000 mL | Freq: Two times a day (BID) | INTRAVENOUS | Status: DC

## 2016-07-21 MED ORDER — HEPARIN SODIUM (PORCINE) 1000 UNIT/ML IJ SOLN
INTRAMUSCULAR | Status: AC
  Filled 2016-07-21: qty 1

## 2016-07-21 MED ORDER — ADENOSINE 12 MG/4ML IV SOLN
INTRAVENOUS | Status: AC
  Filled 2016-07-21: qty 16

## 2016-07-21 MED ORDER — SODIUM CHLORIDE 0.9 % WEIGHT BASED INFUSION
3.0000 mL/kg/h | INTRAVENOUS | Status: AC
  Administered 2016-07-21: 3 mL/kg/h via INTRAVENOUS

## 2016-07-21 MED ORDER — HEPARIN SODIUM (PORCINE) 1000 UNIT/ML IJ SOLN
INTRAMUSCULAR | Status: DC | PRN
  Administered 2016-07-21: 2000 [IU] via INTRAVENOUS
  Administered 2016-07-21 (×2): 3000 [IU] via INTRAVENOUS

## 2016-07-21 MED ORDER — MIDAZOLAM HCL 2 MG/2ML IJ SOLN
INTRAMUSCULAR | Status: DC | PRN
  Administered 2016-07-21 (×2): 1 mg via INTRAVENOUS

## 2016-07-21 MED ORDER — SODIUM CHLORIDE 0.9% FLUSH: 3.0000 mL | INTRAVENOUS | Status: DC | PRN

## 2016-07-21 MED ORDER — ASPIRIN 81 MG PO CHEW
CHEWABLE_TABLET | ORAL | Status: AC
  Administered 2016-07-21: 81 mg via ORAL
  Filled 2016-07-21: qty 1

## 2016-07-21 MED ORDER — IOPAMIDOL (ISOVUE-370) INJECTION 76%
INTRAVENOUS | Status: AC
  Filled 2016-07-21: qty 100

## 2016-07-21 MED ORDER — HEPARIN (PORCINE) IN NACL 2-0.9 UNIT/ML-% IJ SOLN
INTRAMUSCULAR | Status: DC | PRN
  Administered 2016-07-21: 10 mL via INTRA_ARTERIAL

## 2016-07-21 MED ORDER — SODIUM CHLORIDE 0.9 % IV SOLN: 250.0000 mL | INTRAVENOUS | Status: DC | PRN

## 2016-07-21 MED ORDER — FENTANYL CITRATE (PF) 100 MCG/2ML IJ SOLN
INTRAMUSCULAR | Status: AC
  Filled 2016-07-21: qty 2

## 2016-07-21 MED ORDER — LIDOCAINE HCL (PF) 1 % IJ SOLN
INTRAMUSCULAR | Status: DC | PRN
  Administered 2016-07-21: 2 mL

## 2016-07-21 MED ORDER — HEPARIN (PORCINE) IN NACL 2-0.9 UNIT/ML-% IJ SOLN
INTRAMUSCULAR | Status: AC
  Filled 2016-07-21: qty 1000

## 2016-07-21 MED ORDER — ASPIRIN 81 MG PO CHEW
81.0000 mg | CHEWABLE_TABLET | ORAL | Status: AC
  Administered 2016-07-21: 81 mg via ORAL

## 2016-07-21 MED ORDER — SODIUM CHLORIDE 0.9 % WEIGHT BASED INFUSION: 1.0000 mL/kg/h | INTRAVENOUS | Status: DC

## 2016-07-21 MED ORDER — FENTANYL CITRATE (PF) 100 MCG/2ML IJ SOLN
INTRAMUSCULAR | Status: DC | PRN
  Administered 2016-07-21: 25 ug via INTRAVENOUS

## 2016-07-21 MED ORDER — LIDOCAINE HCL 1 % IJ SOLN
INTRAMUSCULAR | Status: AC
  Filled 2016-07-21: qty 20

## 2016-07-21 MED ORDER — ADENOSINE (DIAGNOSTIC) 140MCG/KG/MIN
INTRAVENOUS | Status: DC | PRN
  Administered 2016-07-21: 140 ug/kg/min via INTRAVENOUS

## 2016-07-21 MED ORDER — IOPAMIDOL (ISOVUE-370) INJECTION 76%
INTRAVENOUS | Status: DC | PRN
  Administered 2016-07-21: 85 mL via INTRA_ARTERIAL

## 2016-07-21 MED ORDER — VERAPAMIL HCL 2.5 MG/ML IV SOLN
INTRAVENOUS | Status: AC
  Filled 2016-07-21: qty 2

## 2016-07-21 MED ORDER — ONDANSETRON HCL 4 MG/2ML IJ SOLN: 4.0000 mg | Freq: Four times a day (QID) | INTRAMUSCULAR | Status: DC | PRN

## 2016-07-21 MED ORDER — SODIUM CHLORIDE 0.9 % IV SOLN: INTRAVENOUS | Status: AC

## 2016-07-21 SURGICAL SUPPLY — 15 items
CATH INFINITI 5FR ANG PIGTAIL (CATHETERS) ×1 IMPLANT
CATH LAUNCHER 5F JR4 (CATHETERS) ×1 IMPLANT
CATH LAUNCHER 6FR JR4 (CATHETERS) ×1 IMPLANT
CATH OPTITORQUE TIG 4.0 5F (CATHETERS) ×1 IMPLANT
DEVICE RAD COMP TR BAND LRG (VASCULAR PRODUCTS) IMPLANT
DEVICE RAD TR BAND REGULAR (VASCULAR PRODUCTS) ×1 IMPLANT
GLIDESHEATH SLEND A-KIT 6F 22G (SHEATH) ×1 IMPLANT
GUIDEWIRE INQWIRE 1.5J.035X260 (WIRE) IMPLANT
GUIDEWIRE PRESSURE COMET II (WIRE) ×1 IMPLANT
INQWIRE 1.5J .035X260CM (WIRE) ×2
KIT HEART LEFT (KITS) ×2 IMPLANT
PACK CARDIAC CATHETERIZATION (CUSTOM PROCEDURE TRAY) ×2 IMPLANT
TRANSDUCER W/STOPCOCK (MISCELLANEOUS) ×2 IMPLANT
TUBING CIL FLEX 10 FLL-RA (TUBING) ×2 IMPLANT
VALVE GUARDIAN II ~~LOC~~ HEMO (MISCELLANEOUS) ×1 IMPLANT

## 2016-07-21 NOTE — Progress Notes (Addendum)
Pt complaining of right  hand numbness/ due to TR band. Hand purple weak pulse hand is warm. Per cath Campbell LernerLAb Rodney rt/ in charge, wait one hour vs 2 hours since is FFR case, start trying to remove air in 1 hour, will monitor.

## 2016-07-21 NOTE — H&P (View-Only) (Signed)
Cardiology Office Note    Date:  07/17/2016   ID:  Susan Fischer, DOB 03-30-1960, MRN [id number]  PCP:  Elenora Gamma, MD  Cardiologist:  new - patient seen with Dr. Herbie Baltimore  Chief Complaint  Patient presents with  . New Patient (Initial Visit)    chest pain X 1 month, frequent shortness of breath, ocassional edema in hands, occassional pain in legs, frequent lightheaded or dizziness within the past week   Patient referred for cardiology evaluation of chest pain at the request of Dr. Ermalinda Memos.   History of Present Illness:  Susan Fischer is a 56 y.o. female without significant cardiac history who is being referred by Dr. Ermalinda Memos her primary care provider for evaluation of exertional shortness of breath and chest pain. She has significant FHx of early CAD with her brother having first MI in his 30s and eventually had CABG. She herself has never had any cardiac issues. She has been smoking heavily since she was 12, and continue to smoke to this day. She does not have a prior diagnosis of COPD. She is accompanied by her employer today. She works by picking tomatoes. She says for the past 4 weeks, she is been having worsening dyspnea on exertion. She also described a tightness in her chest associated with exertion but can also occur at rest as well. The frequency of the symptom is worsening. She also described a sharp jabbing pain that lasted only a second at a time this symptom does not symptom to occur with exertion. She denies any exacerbating factors such as deep inspiration, body rotation or palpation.  I discussed her symptom and risk factors with Dr. Herbie Baltimore. Her sharp stabbing pain is atypical, however her dyspnea with exertion and chest pressure associated with exertion is very concerning. I recommended an echocardiogram to establish baseline. We have also discussed with her regarding stress testing versus cardiac catheterization. Given her significant family history, concerning  symptom, chronic smoking, we recommended cardiac catheterization for definitive evaluation. Risk and benefit of the procedure has been discussed with the patient, we plan for cardiac catheterization next week. She was started on 81 mg aspirin and low-dose beta blocker. She does have nightly episode of palpitation as well, low-dose beta blocker should help. Once we assess coronary artery, then we'll decide whether she will start on Lipitor as well.   Past Medical History:  Diagnosis Date  . Arthritis   . Gastroparesis   . Insomnia   . Migraine   . Osteopenia   . Postmenopausal   . Tobacco use disorder   . Vitamin D deficiency   . Weight loss     Past Surgical History:  Procedure Laterality Date  . ABDOMINAL HYSTERECTOMY    . COLONOSCOPY  2011   [place]    Current Medications: Outpatient Medications Prior to Visit  Medication Sig Dispense Refill  . albuterol (PROVENTIL HFA;VENTOLIN HFA) 108 (90 Base) MCG/ACT inhaler Inhale 2 puffs into the lungs every 6 (six) hours as needed for wheezing. 1 Inhaler 2  . zonisamide (ZONEGRAN) 100 MG capsule Take 3 capsules (300 mg total) by mouth at bedtime. (Patient taking differently: Take 300 mg by mouth daily. ) 90 capsule 5  . Vitamin D, Ergocalciferol, (DRISDOL) 50000 units CAPS capsule Take 1 capsule (50,000 Units total) by mouth every 7 (seven) days. (Patient not taking: Reported on 07/16/2016) 12 capsule 0   No facility-administered medications prior to visit.      Allergies:   Codeine; Hydrocodone;  and Robitussin cough-cold d [pseudoephedrine-[id number]]   Social History   Social History  . Marital status: Legally Separated    Spouse name: N/A  . Number of children: 6  . 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 Smoking status: Current Every Day Smoker    Packs/day: 0.50    Types: Cigarettes  . Smokeless tobacco: Never Used     Comment: form given 05-30-13  .  Alcohol use No  . Drug use: No  . Sexual activity: Not Asked   Other Topics Concern  . None   Social History Narrative  . None     Family History:  The patient's family history includes Breast cancer in her paternal grandmother; Diabetes in her maternal grandfather and mother; Heart attack in her brother and sister; Heart attack (age of onset: 2838) in her brother; Hypertension in her son and son; Lung cancer in her father; Other in her brother and sister; Skin cancer in her maternal grandmother.   ROS:   Please see the history of present illness.    ROS All other systems reviewed and are negative.   PHYSICAL EXAM:   VS:  BP 110/70   Pulse 77   Ht 4\' 10"  (1.473 m)   Wt 102 lb (46.3 kg)   BMI 21.32 kg/m    GEN: Well nourished, well developed, in no acute distress  HEENT: normal  Neck: no JVD, carotid bruits, or masses Cardiac: RRR; no murmurs, rubs, or gallops,no edema  Respiratory:  clear to auscultation bilaterally, normal work of breathing GI: soft, nontender, nondistended, + BS MS: no deformity or atrophy  Skin: warm and dry, no rash Neuro:  Alert and Oriented x 3, Strength and sensation are intact Psych: euthymic mood, full affect  Wt Readings from Last 3 Encounters:  07/16/16 102 lb (46.3 kg)  06/25/16 105 lb 6.4 oz (47.8 kg)  03/29/16 114 lb 12.8 oz (52.1 kg)      Studies/Labs Reviewed:   EKG:  EKG is ordered today.  The ekg ordered today demonstrates Normal sinus rhythm without significant ST-T wave changes.  Recent Labs: 06/25/2016: ALT 9; TSH 3.330 07/16/2016: BUN 12; Creatinine, Ser 0.77; Hemoglobin 14.1; Platelets 206; Potassium 4.1; Sodium 141   Lipid Panel    Component Value Date/Time   CHOL 160 06/25/2016 1644   TRIG 114 06/25/2016 1644   HDL 53 06/25/2016 1644   CHOLHDL 3.0 06/25/2016 1644   LDLCALC 84 06/25/2016 1644    Additional studies/ records that were reviewed today include:   Record send by Dr. Ermalinda MemosBradshaw, her primary care  provider   ASSESSMENT:    1. Chest pain, unspecified type   2. Preprocedural cardiovascular examination   3. Palpitation   4. DOE (dyspnea on exertion)      PLAN:  In order of problems listed above:  1. Chest pain occurs both with exertion and at rest: Occur more often with exertion. Also developed significant dyspnea on exertion for the past 4 weeks as well. Despite today's EKG showed no obvious sign of ischemia, her symptom is very concerning. She also have prolonged history of tobacco abuse and family history of early CAD. I have discussed the case with Dr. Herbie BaltimoreHarding, we discussed various options including stress testing versus cardiac catheterization, eventually we recommended cardiac catheterization for definitive assessment. We will also obtain an echocardiogram as well given her dyspnea on exertion. Some of her dyspnea  on exertion is likely related to chronic tobacco abuse, if no significant coronary artery disease is found during cath, may need referral to pulmonology service for PFT evaluation. Start ASA daily  - Risk and benefit of procedure explained to the patient who display clear understanding and agree to proceed.  Discussed with patient possible procedural risk include bleeding, vascular injury, renal injury, arrythmia, MI, stroke and loss of limb or life.  - no obvious contraindication to DES if needed  2. Palpitation: We will start on low-dose bisoprolol 2.5 mg daily, this should help with palpitation as well. We need to monitor for arrhythmia once the patient is in the hospital.    Medication Adjustments/Labs and Tests Ordered: Current medicines are reviewed at length with the patient today.  Concerns regarding medicines are outlined above.  Medication changes, Labs and Tests ordered today are listed in the Patient Instructions below. Patient Instructions  Medication Instructions:   START Bisoprolol 2.5mg  daily.  Labwork:  Pre-cath labs today in  office   Testing/Procedures:    [facility] [address] [place] [address] Dept: [phone] Loc: [phone]  KAYANA THOEN  07/16/2016  You are scheduled for a Cardiac Catheterization on Wednesday, June 13 at 11:30am with Dr. Bryan Lemma.  1. Please arrive at the [facility] (Main Entrance A) at [facility]: [address] [place], [place] [address] at 9:30 AM (two hours before your procedure to ensure your preparation). Free valet parking service is available.   Special note: Every effort is made to have your procedure done on time. Please understand that emergencies sometimes delay scheduled procedures.  2. Diet: Do not eat or drink anything after midnight prior to your procedure except sips of water to take medications.  3. Labs: Today  4. Medication instructions in preparation for your procedure:    On the morning of your procedure, take your Aspirin and your regular morning medicines.  You may use sips of water.  5. Plan for one night stay--bring personal belongings. 6. Bring a current list of your medications and current insurance cards. 7. You MUST have a responsible person to drive you home. 8. Someone MUST be with you the first 24 hours after you arrive home or your discharge will be delayed. 9. Please wear clothes that are easy to get on and off and wear slip-on shoes.  Thank you for allowing [place] to care for you!   -- [place] [facility]   Follow-Up:  After heart catheterization   If you need a refill on your cardiac medications before your next appointment, please call your pharmacy.      Ramond Dial, [place]  07/17/2016 11:50 AM    [facility] [address] [place], [place], [place]  [address] Phone: [phone]; Fax: [phone]

## 2016-07-21 NOTE — Interval H&P Note (Signed)
History and Physical Interval Note:  07/21/2016 12:03 PM  Susan Fischer  has presented today for surgery, with the diagnosis of cp and exertional dyspnea concerning for progressive angina. I personally met and discussed the symptoms and examination findings with Almyra Deforest, PA. With her cardiac risk factors, her symptoms are concerning for progressive angina.    The various methods of treatment have been discussed with the patient and family. After consideration of risks, benefits and other options for treatment, the patient has consented to  Procedure(s): Left Heart Cath and Coronary Angiography (N/A) as a surgical intervention .  The patient's history has been reviewed, patient examined, no change in status, stable for surgery.  I have reviewed the patient's chart and labs.  Questions were answered to the patient's satisfaction.    Cath Lab Visit (complete for each Cath Lab visit)  Clinical Evaluation Leading to the Procedure:   ACS: No.  Non-ACS:    Anginal Classification: CCS III  Anti-ischemic medical therapy: Minimal Therapy (1 class of medications)  Non-Invasive Test Results: No non-invasive testing performed  Prior CABG: No previous CABG   Glenetta Hew

## 2016-07-21 NOTE — Progress Notes (Signed)
Denies dizziness no signs of distress, states her bp is usually low in afternoon, pt states she feels fine.

## 2016-07-21 NOTE — Discharge Instructions (Signed)

## 2016-07-22 ENCOUNTER — Encounter (HOSPITAL_COMMUNITY): Payer: Self-pay | Admitting: Cardiology

## 2016-07-26 ENCOUNTER — Telehealth: Payer: Self-pay

## 2016-07-26 NOTE — Telephone Encounter (Signed)
Patient [facility] regarding [facility]/p [facility] 07/21/2016 Patient understands to follow up with provider:  [facility] 1030 [facility] office Pt understands discharge instructions? yes Pt understands medication regimen? Yes, but she thought she was being prescribed a new med, possibly a cholesterol medication.  Claims she is chest pain free and blood pressure has been lower.

## 2016-07-27 ENCOUNTER — Ambulatory Visit: Payer: BLUE CROSS/BLUE SHIELD | Admitting: Pharmacist

## 2016-07-28 ENCOUNTER — Encounter: Payer: Self-pay | Admitting: Family Medicine

## 2016-08-03 ENCOUNTER — Encounter: Payer: Self-pay | Admitting: Physician Assistant

## 2016-08-03 ENCOUNTER — Ambulatory Visit (INDEPENDENT_AMBULATORY_CARE_PROVIDER_SITE_OTHER): Payer: BLUE CROSS/BLUE SHIELD | Admitting: Physician Assistant

## 2016-08-03 VITALS — BP 94/50 | HR 62 | Ht <= 58 in | Wt 100.8 lb

## 2016-08-03 DIAGNOSIS — Z720 Tobacco use: Secondary | ICD-10-CM

## 2016-08-03 DIAGNOSIS — E785 Hyperlipidemia, unspecified: Secondary | ICD-10-CM

## 2016-08-03 DIAGNOSIS — I2510 Atherosclerotic heart disease of native coronary artery without angina pectoris: Secondary | ICD-10-CM | POA: Diagnosis not present

## 2016-08-03 DIAGNOSIS — Z79899 Other long term (current) drug therapy: Secondary | ICD-10-CM

## 2016-08-03 MED ORDER — ATORVASTATIN CALCIUM 20 MG PO TABS: 20.0000 mg | ORAL_TABLET | Freq: Every day | ORAL | 3 refills | Status: DC

## 2016-08-03 NOTE — Progress Notes (Signed)
Cardiology Office Note    Date:  08/03/2016   ID:  Susan Fischer, DOB December 04, 1960, MRN [id number]  PCP:  Susan Gamma, MD  Cardiologist:  Dr. Herbie Baltimore  Chief Complaint  Patient presents with  . Follow-up    seen for Dr. Herbie Baltimore, post cath, no intervention    History of Present Illness:  Susan Fischer is a 56 y.o. female  without significant cardiac history who is being referred by Dr. Ermalinda Memos her primary care provider for evaluation of exertional shortness of breath and chest pain. She has significant FHx of early CAD with her brother having first MI in his 30s and eventually had CABG. She herself has never had any cardiac issues. She has been smoking heavily since she was 12, and continue to smoke to this day. She does not have a prior diagnosis of COPD. I last saw the patient in the office on 6/80/2018, the case was discussed with Dr. Herbie Baltimore, she was complaining of exertional type of chest discomfort or shortness of breath for at least 4 weeks. We set her up for cardiac catheterization, this revealed a 60% left circumflex lesion with negative FFR. Aggressive medical therapy was recommended.  Since her discharge, she has been doing well. She denies significant chest discomfort, she still has dyspnea associated with exertion. She occasionally notice some high blood pressure in the morning and low blood pressure at night. Blood pressure in the office today was 94/50. I manually we checked it using a pediatric cuff, systolic blood pressure was 98. She has been losing weight. She lost another 5 pounds in the past month. Given her heavy smoking history and recent weight loss, I will defer to her primary care physician to consider obtaining annual chest x-ray to screen for lung cancer. She is determined to quit smoking after recent event. We did discuss correlation between tobacco use with cardiac and pulmonary issue. I will continue her on low-dose bisoprolol as long as she is asymptomatic.  Her LDL is 84, given significant disease in her coronary arteries, her goal should be less than 70. I recommended addition of Lipitor 20 mg daily. She will need a fasting lipid panel and LFTs in 6 weeks. At this time, her dyspnea associated with exertion likely can be attributed to pulmonary issue, all the more reason for her to quit smoking.   Past Medical History:  Diagnosis Date  . Arthritis   . Gastroparesis   . Insomnia   . Migraine   . Osteopenia   . Postmenopausal   . Tobacco use disorder   . Vitamin D deficiency   . Weight loss     Past Surgical History:  Procedure Laterality Date  . ABDOMINAL HYSTERECTOMY    . COLONOSCOPY  2011   [place]  . INTRAVASCULAR PRESSURE WIRE/FFR STUDY N/A 07/21/2016   Procedure: Intravascular Pressure Wire/FFR Study;  Surgeon: Susan Lex, MD;  Location: [facility] INVASIVE CV LAB;  Service: Cardiovascular;  Laterality: N/A;  . LEFT HEART CATH AND CORONARY ANGIOGRAPHY N/A 07/21/2016   Procedure: Left Heart Cath and Coronary Angiography;  Surgeon: Susan Lex, MD;  Location: [facility] INVASIVE CV LAB;  Service: Cardiovascular;  Laterality: N/A;    Current Medications: Outpatient Medications Prior to Visit  Medication Sig Dispense Refill  . albuterol (PROVENTIL HFA;VENTOLIN HFA) 108 (90 Base) MCG/ACT inhaler Inhale 2 puffs into the lungs every 6 (six) hours as needed for wheezing. 1 Inhaler 2  . aspirin EC 81 MG tablet Take 81 mg  by mouth daily.    . bisoprolol (ZEBETA) 5 MG tablet Take 0.5 tablets (2.5 mg total) by mouth daily. 30 tablet 3  . Cholecalciferol (VITAMIN D) 2000 units CAPS Take 1 capsule by mouth daily.     Marland Kitchen zonisamide (ZONEGRAN) 100 MG capsule Take 3 capsules (300 mg total) by mouth at bedtime. (Patient not taking: Reported on 08/03/2016) 90 capsule 5   No facility-administered medications prior to visit.      Allergies:   Codeine; Hydrocodone; and Robitussin cough-cold d [[id number]]   Social History   Social History  .  Marital status: Legally Separated    Spouse name: N/A  . Number of children: 6  . 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 Smoking status: Current Every Day Smoker    Packs/day: 0.50    Types: Cigarettes  . Smokeless tobacco: Never Used     Comment: form given 05-30-13  . Alcohol use No  . Drug use: No  . Sexual activity: Not Asked   Other Topics Concern  . None   Social History Narrative  . None     Family History:  The patient's family history includes Breast cancer in her paternal grandmother; Diabetes in her maternal grandfather and mother; Heart attack in her brother and sister; Heart attack (age of onset: 17) in her brother; Hypertension in her son and son; Lung cancer in her father; Other in her brother and sister; Skin cancer in her maternal grandmother.   ROS:   Please see the history of present illness.    ROS All other systems reviewed and are negative.   PHYSICAL EXAM:   VS:  BP (!) 94/50   Pulse 62   Ht 4\' 10"  (1.473 m)   Wt 100 lb 12.8 oz (45.7 kg)   BMI 21.07 kg/m    GEN: Well nourished, well developed, in no acute distress  HEENT: normal  Neck: no JVD, carotid bruits, or masses Cardiac: RRR; no murmurs, rubs, or gallops,no edema  Respiratory:  clear to auscultation bilaterally, normal work of breathing GI: soft, nontender, nondistended, + BS MS: no deformity or atrophy  Skin: warm and dry, no rash Neuro:  Alert and Oriented x 3, Strength and sensation are intact Psych: euthymic mood, full affect  Wt Readings from Last 3 Encounters:  08/03/16 100 lb 12.8 oz (45.7 kg)  07/21/16 99 lb (44.9 kg)  07/16/16 102 lb (46.3 kg)      Studies/Labs Reviewed:   EKG:  EKG is not ordered today.    Recent Labs: 06/25/2016: ALT 9; TSH 3.330 07/16/2016: BUN 12; Creatinine, Ser 0.77; Hemoglobin 14.1; Platelets 206; Potassium 4.1; Sodium 141   Lipid Panel    Component Value  Date/Time   CHOL 160 06/25/2016 1644   TRIG 114 06/25/2016 1644   HDL 53 06/25/2016 1644   CHOLHDL 3.0 06/25/2016 1644   LDLCALC 84 06/25/2016 1644    Additional studies/ records that were reviewed today include:   Cath 07/21/2016 Conclusion     Prox RCA lesion, 30 %stenosed. Dist RCA lesion, 65 %stenosed. - Tandem lesions evaluated with FFR - 0.88. Physiologically not significant.  Mid Cx lesion, 60 %stenosed. - There are some bruises with this appears to be maybe more significant, others look minimal. The vessel itself is roughly 2 mm at maximum diameter. Not a great PCI target  The left ventricular systolic function is normal. The left  ventricular ejection fraction is 55-65% by visual estimate.  LV end diastolic pressure is normal.   Patient does have moderate disease in the RCA and circumflex, neither appeared to be intragraft severe. The RCA lesion appeared to be slightly more significant and large vessel. I evaluated this with FFR which was negative for ischemia.  The circumflex lesion and some images appears to be 20-30 % and in other images appears to be 60-70%.   Surprisingly no obvious culprit lesion to explain the patient's symptoms. We'll continue to monitor symptomatically. If symptoms persist, we would potentially consider a Myoview stress test to evaluate for inferolateral ischemia from the circumflex lesion.  Plan: With existing moderate coronary disease, would continue aggressive risk factor management. She'll be discharged home from short stay today and seen in follow-up as scheduled.      ASSESSMENT:    1. Coronary artery disease involving native coronary artery of native heart without angina pectoris   2. Hyperlipidemia LDL goal <70   3. Encounter for long-term (current) use of medications   4. Tobacco abuse      PLAN:  In order of problems listed above:  1. Coronary artery disease: 65% RCA and a 60% left circumflex lesion. RCA lesion did not have  significant FFR reading during cath. Some of her dyspnea on exertion likely related to chronic tobacco abuse. We emphasized the need to quit smoking, which she agrees and plan to quit. Continue on daily aspirin. Borderline blood pressure today, I rechecked it myself, systolic blood pressure was actually 98 on manual recheck. We'll continue on low dose Bystolic at this time.  2. Hyperlipidemia: Plan to add low-dose Lipitor, last lipid panel shows LDL 84 on 06/25/2016. Repeat fasting lipid panel and LFTs in 6 weeks.  3. Tobacco abuse: Long discussion with the patient regarding need to quit smoking, she understands and determined to quit. Her weight has been decreasing during the recent month, she lost another 5 pounds since last month. With her history of smoking, I would highly recommend periodic lung cancer screening, I have not seen a chest x-ray in Epic since October 2014, will defer to primary care physician.    Medication Adjustments/Labs and Tests Ordered: Current medicines are reviewed at length with the patient today.  Concerns regarding medicines are outlined above.  Medication changes, Labs and Tests ordered today are listed in the Patient Instructions below. Patient Instructions  Medication Instructions:   START Lipitor (Atorvastatin) 20mg  DAILY  Labwork:   Fasting cholesterol and liver function tests in 6 weeks  Testing/Procedures:  none  Follow-Up:   3-4 months with Dr. Herbie BaltimoreHarding  If you need a refill on your cardiac medications before your next appointment, please call your pharmacy.      Ramond DialSigned, Truda Staub, [place]  08/03/2016 12:10 PM    [facility] [address], [place], [place]  [address] Phone: [phone]; Fax: [phone]

## 2016-08-03 NOTE — Patient Instructions (Signed)
Medication Instructions:   START Lipitor (Atorvastatin) 20mg  DAILY  Labwork:   Fasting cholesterol and liver function tests in 6 weeks  Testing/Procedures:  none  Follow-Up:   3-4 months with Dr. Herbie BaltimoreHarding  If you need a refill on your cardiac medications before your next appointment, please call your pharmacy.

## 2016-08-04 ENCOUNTER — Encounter: Payer: BLUE CROSS/BLUE SHIELD | Admitting: *Deleted

## 2016-08-06 ENCOUNTER — Ambulatory Visit: Payer: Self-pay | Admitting: Interventional Cardiology

## 2016-09-06 ENCOUNTER — Other Ambulatory Visit: Payer: BLUE CROSS/BLUE SHIELD

## 2016-09-06 DIAGNOSIS — I2510 Atherosclerotic heart disease of native coronary artery without angina pectoris: Secondary | ICD-10-CM | POA: Diagnosis not present

## 2016-09-06 DIAGNOSIS — Z79899 Other long term (current) drug therapy: Secondary | ICD-10-CM | POA: Diagnosis not present

## 2016-09-06 DIAGNOSIS — E785 Hyperlipidemia, unspecified: Secondary | ICD-10-CM | POA: Diagnosis not present

## 2016-09-07 LAB — HEPATIC FUNCTION PANEL
ALK PHOS: 66 IU/L (ref 39–117)
ALT: 11 IU/L (ref 0–32)
AST: 21 IU/L (ref 0–40)
Albumin: 4.5 g/dL (ref 3.5–5.5)
BILIRUBIN TOTAL: 0.3 mg/dL (ref 0.0–1.2)
BILIRUBIN, DIRECT: 0.09 mg/dL (ref 0.00–0.40)
Total Protein: 6.6 g/dL (ref 6.0–8.5)

## 2016-09-07 LAB — LIPID PANEL
CHOLESTEROL TOTAL: 109 mg/dL (ref 100–199)
Chol/HDL Ratio: 2.2 ratio (ref 0.0–4.4)
HDL: 50 mg/dL (ref >39)
LDL CALC: 44 mg/dL (ref 0–99)
TRIGLYCERIDES: 77 mg/dL (ref 0–149)
VLDL Cholesterol Cal: 15 mg/dL (ref 5–40)

## 2016-10-18 ENCOUNTER — Encounter: Payer: Self-pay | Admitting: Family Medicine

## 2016-10-18 ENCOUNTER — Ambulatory Visit (INDEPENDENT_AMBULATORY_CARE_PROVIDER_SITE_OTHER): Payer: BLUE CROSS/BLUE SHIELD | Admitting: Family Medicine

## 2016-10-18 VITALS — BP 128/75 | HR 66 | Temp 97.4°F | Ht <= 58 in | Wt 95.2 lb

## 2016-10-18 DIAGNOSIS — F17200 Nicotine dependence, unspecified, uncomplicated: Secondary | ICD-10-CM

## 2016-10-18 DIAGNOSIS — G43909 Migraine, unspecified, not intractable, without status migrainosus: Secondary | ICD-10-CM | POA: Diagnosis not present

## 2016-10-18 DIAGNOSIS — R198 Other specified symptoms and signs involving the digestive system and abdomen: Secondary | ICD-10-CM

## 2016-10-18 MED ORDER — ZONISAMIDE 100 MG PO CAPS: 200.0000 mg | ORAL_CAPSULE | Freq: Every day | ORAL | 3 refills | Status: DC

## 2016-10-18 NOTE — Progress Notes (Signed)
   HPI  Patient presents today here for follow-up of chronic medical conditions.  Patient denies chest pain, we reviewed her recent heart cath results.  Migraine headache Patient has been treated long-term with Zonegran, needs refill, taking 200 mg once daily. This was started by neurology years ago.  Tobacco abuse Patient down to 6 cigarettes a day, she was congratulated.  Rectal fullness Notes a few months history of rectal fullness, states that she has the feeling of needing to use the restroom even after having been bowel movement. No change in stool caliber. She also complains of some dark black stools that improved after changing vitamin D to 3 times weekly instead of daily.  PMH: Smoking status noted ROS: Per HPI  Objective: BP 128/75   Pulse 66   Temp (!) 97.4 F (36.3 C) (Oral)   Ht 4\' 10"  (1.473 m)   Wt 95 lb 3.2 oz (43.2 kg)   BMI 19.90 kg/m  Gen: NAD, alert, cooperative with exam HEENT: NCAT CV: RRR, good S1/S2, no murmur Resp: CTABL, no wheezes, non-labored Ext: No edema, warm Neuro: Alert and oriented, No gross deficits Rectal No palpable mass or external hemorrhoid at the anal verge, normal to increased rectal tone  Assessment and plan:  # Rectal fullness Given her dark stools as well as her feeling of rectal fullness I recommended GI referral, she had a colonoscopy 5-6 years ago. Referral placed to her previous GI provider  # Tobacco abuse Congratulated on decreasing, she's continuing to decrease  # Migraine headache With history of aneurysms, refill Zonegran Controlled  follow up 4 months   Orders Placed This Encounter  Procedures  . Ambulatory referral to Gastroenterology    Referral Priority:   Routine    Referral Type:   Consultation    Referral Reason:   Specialty Services Required    Referred to Provider:   Meryl DareStark, Malcolm T, MD    Number of Visits Requested:   1    Meds ordered this encounter  Medications  . zonisamide  (ZONEGRAN) 100 MG capsule    Sig: Take 2 capsules (200 mg total) by mouth daily.    Dispense:  180 capsule    Refill:  3    [name], MD [facility] 10/18/2016, 8:22 AM

## 2016-11-03 ENCOUNTER — Encounter: Payer: Self-pay | Admitting: Cardiology

## 2016-11-03 ENCOUNTER — Ambulatory Visit (INDEPENDENT_AMBULATORY_CARE_PROVIDER_SITE_OTHER): Payer: BLUE CROSS/BLUE SHIELD | Admitting: Cardiology

## 2016-11-03 VITALS — BP 106/58 | HR 68 | Ht 59.0 in | Wt 94.0 lb

## 2016-11-03 DIAGNOSIS — I2510 Atherosclerotic heart disease of native coronary artery without angina pectoris: Secondary | ICD-10-CM

## 2016-11-03 DIAGNOSIS — R0609 Other forms of dyspnea: Secondary | ICD-10-CM

## 2016-11-03 DIAGNOSIS — R0600 Dyspnea, unspecified: Secondary | ICD-10-CM

## 2016-11-03 DIAGNOSIS — I200 Unstable angina: Secondary | ICD-10-CM | POA: Diagnosis not present

## 2016-11-03 DIAGNOSIS — R634 Abnormal weight loss: Secondary | ICD-10-CM | POA: Diagnosis not present

## 2016-11-03 DIAGNOSIS — F17200 Nicotine dependence, unspecified, uncomplicated: Secondary | ICD-10-CM

## 2016-11-03 DIAGNOSIS — E785 Hyperlipidemia, unspecified: Secondary | ICD-10-CM | POA: Insufficient documentation

## 2016-11-03 NOTE — Patient Instructions (Signed)
NEED LABS IN 6 MONTHS - MARCH LIPID CMP DO NOT  EAT OR DRINK THE MORNING OF THE TEST  MAY COME TO OFFICE NO  APPOINTMENT NEEDED OFFICE HOURS 8 AM  TO 4 PM WILL MAIL YOU LABSLIP    KEEP USING  ENSURE FOR CALORIES KEEP HYDRATE IF DEVELOP CRAMPS AS DISCUSSED IN OFFICE VISIT- DRINK SOME GATORADE   Your physician recommends that you schedule a follow-up appointment in 6 MONTHS WITH HAO MENG PA      Your physician wants you to follow-up in 12 MONTHS WITH DR HARDING. You will receive a reminder letter in the mail two months in advance. If you don't receive a letter, please call our office to schedule the follow-up appointment.

## 2016-11-03 NOTE — Progress Notes (Signed)
PCP: Elenora Gamma, MD  Clinic Note: Chief Complaint  Patient presents with  . Follow-up    Post cath    HPI: Susan Fischer is a 56 y.o. female with a PMH below who presents today for 3 month follow-up for her moderate CAD.  Kerby Less was initially seen by Mr. Lisabeth Devoid, [place] for shortness of breath and chest discomfort with a significant family history of MI. First visit was in early June. We were concerned for the progressive nature of her symptoms and we recommended cardiac catheterization. She will underwent cardiac catheterization on June 13 revealing moderate, nonobstructive disease - recommended medical management. She was then seen in follow-up by Azalee Course, PA on June 26 - she actually is not noticing as chest pain /dyspnea.  Recent Hospitalizations: OP Cath 07/21/2016  Studies Personally Reviewed - (if available, images/films reviewed: From Epic Chart or Care Everywhere)  07/21/2016 - Left Heart Cath and Coronary Angiography; Intravascular Pressure Wire/FFR Study  Prox RCA lesion, 30 %stenosed. Dist RCA lesion, 65 %stenosed. - Tandem lesions evaluated with FFR - 0.88. Physiologically not significant.  Mid Cx lesion, 60 %stenosed. - some view seem more significant, others look minimal. ~ 2 mm at maximum diameter. Not a great PCI target  The left ventricular systolic function is normal. The left ventricular ejection fraction is 55-65% by visual estimate.  LV end diastolic pressure is normal.  Diagnostic Diagram     Interval History: He returns today overall doing quite well. She really has not had anymore of the chest discomfort symptoms that she had before. She says occasionally she'll a fluttering sensation in her chest when she coughs, but is not really associated with any chest discomfort. A lot of fluttering is improved with being on the Bystolic. She's not having her exertional chest pain, she also knows that she is not doing any significant heavy lifting at work.  She is doing lighter duty and therefore has not had as much overall discomfort.   Occasionally during the day she may feel a little lightheaded or dizzy and has felt somewhat exhausted at the end of the day.  She has really had a poor appetite with poor appropriate by mouth intake for the last month or so and has lost some weight. She is now trying to do ensure shakes to supplement herself.  She's not really having any resting or exertional chest pain at this point but not really exerting herself that much. No significant exertional dyspnea despite having some fluttering sensations and some lightheadedness she's not having any syncope or near-syncope. No TIA or amaurosis fugax symptoms.  No PND, orthopnea or edema. No claudication  ROS: A comprehensive was performed. Review of Systems  Constitutional: Positive for malaise/fatigue (Feels tired at the end of the day, somewhat exhausted) and weight loss (no eating as well.).  HENT: Negative for congestion and nosebleeds.   Respiratory: Negative for cough and shortness of breath.   Cardiovascular: Positive for leg swelling (Trivial).       Per history of present illness  Gastrointestinal: Negative for abdominal pain, blood in stool and melena.       Poor PO Intake - early satiety; poor appetite  Genitourinary: Negative for dysuria, hematuria and urgency.  Musculoskeletal: Negative for joint pain.  Neurological: Positive for dizziness (Per history of present illness).  Psychiatric/Behavioral: Negative for depression and memory loss. The patient is not nervous/anxious and does not have insomnia.   All other systems reviewed and are negative.  I have reviewed and (if needed) personally updated the patient's problem list, medications, allergies, past medical and surgical history, social and family history.   Past Medical History:  Diagnosis Date  . Arthritis   . Coronary artery disease, non-occlusive 07/2016   65% d RCA (FFR 0.88), ~60% small  mCx (~2 mm). normal EF.  Marland Kitchen Gastroparesis   . Insomnia   . Migraine   . Osteopenia   . Postmenopausal   . Tobacco use disorder   . Vitamin D deficiency   . Weight loss     Past Surgical History:  Procedure Laterality Date  . ABDOMINAL HYSTERECTOMY    . COLONOSCOPY  2011   [place]  . INTRAVASCULAR PRESSURE WIRE/FFR STUDY N/A 07/21/2016   Procedure: Intravascular Pressure Wire/FFR Study;  Surgeon: Marykay Lex, MD;  Location: [facility] INVASIVE CV LAB;  Service: Cardiovascular;  Laterality: RCA - FFR 0.88 - Not Significant  . LEFT HEART CATH AND CORONARY ANGIOGRAPHY N/A 07/21/2016   Procedure: Left Heart Cath and Coronary Angiography;  Surgeon: Marykay Lex, MD;  Location: [facility] INVASIVE CV LAB:  pRCA ~30%, dRCA 65% (FFR 0.88), mCx (small ~31mm) ~60%.  EF 55-65%. Normal LVEDP    Current Meds  Medication Sig  . albuterol (PROVENTIL HFA;VENTOLIN HFA) 108 (90 Base) MCG/ACT inhaler Inhale 2 puffs into the lungs every 6 (six) hours as needed for wheezing.  Marland Kitchen aspirin EC 81 MG tablet Take 81 mg by mouth daily.  Marland Kitchen atorvastatin (LIPITOR) 20 MG tablet Take 20 mg by mouth daily.  . bisoprolol (ZEBETA) 5 MG tablet Take 0.5 tablets (2.5 mg total) by mouth daily.  . Cholecalciferol (VITAMIN D) 2000 units CAPS Take 1 capsule by mouth daily. 3 times a week  . zonisamide (ZONEGRAN) 100 MG capsule Take 2 capsules (200 mg total) by mouth daily.    Allergies  Allergen Reactions  . Codeine   . Hydrocodone   . Robitussin Cough-Cold D [Pseudoephedrine-Dm-Gg]     Social History   Social History  . Marital status: Legally Separated    Spouse name: N/A  . Number of children: 6  . 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 Smoking status: Current Every Day Smoker    Packs/day: 0.50    Types: Cigarettes  . Smokeless tobacco: Never Used     Comment: form given 05-30-13  . Alcohol use No  . Drug use: No  . Sexual activity:  Not Asked   Other Topics Concern  . None   Social History Narrative  . None    family history includes Breast cancer in her paternal grandmother; Diabetes in her maternal grandfather and mother; Heart attack in her brother and sister; Heart attack (age of onset: 69) in her brother; Hypertension in her son and son; Lung cancer in her father; Other in her brother and sister; Skin cancer in her maternal grandmother.  Wt Readings from Last 3 Encounters:  11/03/16 94 lb (42.6 kg)  10/18/16 95 lb 3.2 oz (43.2 kg)  08/03/16 100 lb 12.8 oz (45.7 kg)  -- now drinking Ensure (has GI f/u scheduled)  PHYSICAL EXAM BP (!) 106/58   Pulse 68   Ht  (1.499 m)   Wt 94 lb (42.6 kg)   SpO2 97%   BMI 18.99 kg/m  Physical Exam  Constitutional: She is oriented to person, place, and time. No distress.  Thin and frail. Well-groomed  HENT:  Head: Normocephalic and atraumatic.  Eyes: EOM are normal.  Neck: Normal range of motion. Neck supple. No hepatojugular reflux and no JVD present. Carotid bruit is not present.  Cardiovascular: Normal rate, regular rhythm and intact distal pulses.  Exam reveals friction rub. Exam reveals no gallop.   No murmur heard. Pulmonary/Chest: Effort normal and breath sounds normal. No respiratory distress. She has no wheezes. She has no rales.  Abdominal: Soft. Bowel sounds are normal. She exhibits no distension. There is no tenderness. There is no rebound.  Scaphoid  Musculoskeletal: Normal range of motion. She exhibits edema.  Neurological: She is alert and oriented to person, place, and time.  Skin: Skin is warm and dry. No rash noted. No erythema. There is pallor.  Psychiatric: She has a normal mood and affect. Her behavior is normal. Thought content normal.  Nursing note and vitals reviewed.    Adult ECG Report n/a  Other studies Reviewed: Additional studies/ records that were reviewed today include:  Recent Labs:   Lab Results  Component Value Date    CHOL 109 09/06/2016   HDL 50 09/06/2016   LDLCALC 44 09/06/2016   TRIG 77 09/06/2016   CHOLHDL 2.2 09/06/2016    ASSESSMENT / PLAN: Problem List Items Addressed This Visit    Coronary artery disease, non-occlusive - Primary (Chronic)    She has nonobstructive disease albeit moderate in the RCA and circumflex. She may have some microvascular disease that is causing some symptoms as well. With her blood pressure she really won't tolerate much more than the current dose of bisoprolol along with aspirin and statin.  With limited symptoms since her last follow-up, I suspect that she'll be okay going forward.      Relevant Medications   atorvastatin (LIPITOR) 20 MG tablet   Other Relevant Orders   Comprehensive metabolic panel   Exertional dyspnea    I suspect that this is probably now are related to deconditioning, smoking/COPD changes.      Hyperlipidemia with target low density lipoprotein (LDL) cholesterol less than 70 mg/dL (Chronic)    Last labs were checked in July and were well within goal. I think a lot of this is related to her malnutrition. I actually think she needs to put on some weight and increase her fat level. More important she probably needs to stop smoking. She will continue current low dose of statin.  Recheck labs in March.      Relevant Medications   atorvastatin (LIPITOR) 20 MG tablet   Other Relevant Orders   Lipid panel   Comprehensive metabolic panel   Progressive angina (HCC)    This is perhaps related to small vessel disease or maybe it was not cardiac in nature. She does have some borderline disease and we will therefore maximize medical management.      Relevant Medications   atorvastatin (LIPITOR) 20 MG tablet   Tobacco use disorder    Counseled her on the importance of smoking cessation. This is the biggest risk factor she has right now besides her malnutrition.      Weight loss    Not gaining any weight. She is now finally starting to start  using ensure shakes. I recommend that she doesn't 3 times a day to continue gaining muscle mass.      Relevant Orders   Lipid panel   Comprehensive metabolic panel      Current medicines are reviewed at length with the patient today. (+/- concerns) n/a The following  changes have been made: n/a  Patient Instructions  NEED LABS IN 6 MONTHS - MARCH LIPID CMP DO NOT  EAT OR DRINK THE MORNING OF THE TEST  MAY COME TO OFFICE NO  APPOINTMENT NEEDED OFFICE HOURS 8 AM  TO 4 PM WILL MAIL YOU LABSLIP    KEEP USING  ENSURE FOR CALORIES KEEP HYDRATE IF DEVELOP CRAMPS AS DISCUSSED IN OFFICE VISIT- DRINK SOME GATORADE   Your physician recommends that you schedule a follow-up appointment in 6 MONTHS WITH HAO MENG PA      Your physician wants you to follow-up in 12 MONTHS WITH DR HARDING. You will receive a reminder letter in the mail two months in advance. If you don't receive a letter, please call our office to schedule the follow-up appointment.    Studies Ordered:   Orders Placed This Encounter  Procedures  . Lipid panel  . Comprehensive metabolic panel      Bryan Lemma, M.D., M.S. Interventional Cardiologist   Pager # [phone] Phone # [phone] [address] [place], [place] [address]

## 2016-11-05 ENCOUNTER — Encounter: Payer: Self-pay | Admitting: Cardiology

## 2016-11-05 NOTE — Assessment & Plan Note (Signed)
She has nonobstructive disease albeit moderate in the RCA and circumflex. She may have some microvascular disease that is causing some symptoms as well. With her blood pressure she really won't tolerate much more than the current dose of bisoprolol along with aspirin and statin.  With limited symptoms since her last follow-up, I suspect that she'll be okay going forward.

## 2016-11-05 NOTE — Assessment & Plan Note (Signed)
This is perhaps related to small vessel disease or maybe it was not cardiac in nature. She does have some borderline disease and we will therefore maximize medical management.

## 2016-11-05 NOTE — Assessment & Plan Note (Addendum)
Last labs were checked in July and were well within goal. I think a lot of this is related to her malnutrition. I actually think she needs to put on some weight and increase her fat level. More important she probably needs to stop smoking. She will continue current low dose of statin.  Recheck labs in March.

## 2016-11-05 NOTE — Assessment & Plan Note (Signed)
Not gaining any weight. She is now finally starting to start using ensure shakes. I recommend that she doesn't 3 times a day to continue gaining muscle mass.

## 2016-11-05 NOTE — Assessment & Plan Note (Signed)
I suspect that this is probably now are related to deconditioning, smoking/COPD changes.

## 2016-11-05 NOTE — Assessment & Plan Note (Signed)
Counseled her on the importance of smoking cessation. This is the biggest risk factor she has right now besides her malnutrition.

## 2016-12-04 ENCOUNTER — Other Ambulatory Visit: Payer: Self-pay | Admitting: Physician Assistant

## 2016-12-06 NOTE — Telephone Encounter (Signed)
Refill Request.  

## 2017-01-10 ENCOUNTER — Ambulatory Visit: Payer: BLUE CROSS/BLUE SHIELD | Admitting: Gastroenterology

## 2017-02-17 ENCOUNTER — Ambulatory Visit: Payer: BLUE CROSS/BLUE SHIELD | Admitting: Family Medicine

## 2017-02-17 ENCOUNTER — Encounter: Payer: Self-pay | Admitting: Family Medicine

## 2017-02-17 VITALS — BP 124/69 | HR 76 | Temp 97.1°F | Ht 59.0 in | Wt 99.4 lb

## 2017-02-17 DIAGNOSIS — J189 Pneumonia, unspecified organism: Secondary | ICD-10-CM

## 2017-02-17 DIAGNOSIS — F17200 Nicotine dependence, unspecified, uncomplicated: Secondary | ICD-10-CM

## 2017-02-17 DIAGNOSIS — L659 Nonscarring hair loss, unspecified: Secondary | ICD-10-CM | POA: Diagnosis not present

## 2017-02-17 DIAGNOSIS — G479 Sleep disorder, unspecified: Secondary | ICD-10-CM

## 2017-02-17 MED ORDER — ALBUTEROL SULFATE HFA 108 (90 BASE) MCG/ACT IN AERS: 2.0000 | INHALATION_SPRAY | Freq: Four times a day (QID) | RESPIRATORY_TRACT | 2 refills | Status: DC | PRN

## 2017-02-17 MED ORDER — AZITHROMYCIN 250 MG PO TABS: ORAL_TABLET | ORAL | 0 refills | Status: DC

## 2017-02-17 MED ORDER — ZONISAMIDE 100 MG PO CAPS: 200.0000 mg | ORAL_CAPSULE | Freq: Every day | ORAL | 3 refills | Status: DC

## 2017-02-17 MED ORDER — METHYLPREDNISOLONE ACETATE 80 MG/ML IJ SUSP
80.0000 mg | Freq: Once | INTRAMUSCULAR | Status: AC
  Administered 2017-02-17: 80 mg via INTRAMUSCULAR

## 2017-02-17 MED ORDER — BISOPROLOL FUMARATE 5 MG PO TABS: 2.5000 mg | ORAL_TABLET | Freq: Every day | ORAL | 1 refills | Status: DC

## 2017-02-17 NOTE — Patient Instructions (Signed)
Great to see you!  Try Melatonin 5-10 mg for sleep  Come back in 4 months

## 2017-02-17 NOTE — Progress Notes (Signed)
   HPI  Patient presents today here for follow-up chronic medical conditions as well as cough.  Patient states that she has been ill for about 1 month.  It began as a sinus issue and then slowly progressed to persistent cough that she has now.  She states that is productive of thick white sputum. She states that it has been going on for so long she is concerned that it may be something more serious. She is tolerating food and fluids like usual.  She denies shortness of breath. She is a smoker but is cutting back.  She also complains of hair loss for about 6 months, small amounts of hair loss, no clumps.  She is a smoker, she is trying to cut back.  She is also having difficulty sleeping. She is tried Tylenol PM with no improvement  PMH: Smoking status noted ROS: Per HPI  Objective: BP 124/69   Pulse 76   Temp (!) 97.1 F (36.2 C) (Oral)   Ht 4\' 11"  (1.499 m)   Wt 99 lb 6.4 oz (45.1 kg)   SpO2 99%   BMI 20.08 kg/m  Gen: NAD, alert, cooperative with exam HEENT: NCAT CV: RRR, good S1/S2, no murmur Resp: Nonlabored, coarse breath sounds scattered, good air movement Ext: No edema, warm Neuro: Alert and oriented, No gross deficits  Assessment and plan:  #Atypical pneumonia Patient with cough and illness now for several weeks Is a throat plus IM Depo-Medrol with history of smoking, possible underlying component of COPD or chronic bronchitis   #Tobacco use disorder Recommended cessation, she is cutting back  #Loss Unclear etiology, reassurance provided, continue to follow, offered additional labs, she defers  #Difficulty sleeping Trial of melatonin  Meds ordered this encounter  Medications  . albuterol (PROVENTIL HFA;VENTOLIN HFA) 108 (90 Base) MCG/ACT inhaler    Sig: Inhale 2 puffs into the lungs every 6 (six) hours as needed for wheezing.    Dispense:  1 Inhaler    Refill:  2  . bisoprolol (ZEBETA) 5 MG tablet    Sig: Take 0.5 tablets (2.5 mg total) by mouth daily.     Dispense:  90 tablet    Refill:  1  . methylPREDNISolone acetate (DEPO-MEDROL) injection 80 mg  . azithromycin (ZITHROMAX) 250 MG tablet    Sig: Take 2 tablets on day 1 and 1 tablet daily after that    Dispense:  6 tablet    Refill:  0  . zonisamide (ZONEGRAN) 100 MG capsule    Sig: Take 2 capsules (200 mg total) by mouth daily.    Dispense:  180 capsule    Refill:  3    Murtis SinkSam Benay Pomeroy, MD [facility] 02/17/2017, 8:14 AM

## 2017-03-16 ENCOUNTER — Encounter: Payer: Self-pay | Admitting: Family Medicine

## 2017-06-17 ENCOUNTER — Ambulatory Visit: Payer: BLUE CROSS/BLUE SHIELD | Admitting: Family Medicine

## 2017-06-17 ENCOUNTER — Encounter: Payer: Self-pay | Admitting: Family Medicine

## 2017-06-17 VITALS — BP 127/69 | HR 57 | Temp 97.5°F | Ht 59.0 in | Wt 94.6 lb

## 2017-06-17 DIAGNOSIS — E785 Hyperlipidemia, unspecified: Secondary | ICD-10-CM

## 2017-06-17 DIAGNOSIS — F17200 Nicotine dependence, unspecified, uncomplicated: Secondary | ICD-10-CM | POA: Diagnosis not present

## 2017-06-17 DIAGNOSIS — Z1159 Encounter for screening for other viral diseases: Secondary | ICD-10-CM | POA: Diagnosis not present

## 2017-06-17 DIAGNOSIS — I2510 Atherosclerotic heart disease of native coronary artery without angina pectoris: Secondary | ICD-10-CM | POA: Diagnosis not present

## 2017-06-17 NOTE — Patient Instructions (Signed)
Great to see you!   

## 2017-06-17 NOTE — Progress Notes (Signed)
   HPI  Patient presents today for follow-up chronic medical conditions.  CAD Medication compliance, no chest pain.  Smoking Has cut back from 3 packs/day to 1/2 pack/day, still trying to cut back.  Hyperlipidemia Fasting, tolerating Lipitor well, good medication compliance.  Had colonoscopy at [facility] 6 years ago, normal, every 10-year planned  PMH: Smoking status noted ROS: Per HPI  Objective: BP 127/69   Pulse (!) 57   Temp (!) 97.5 F (36.4 C) (Oral)   Ht '4\' 11"'$  (1.499 m)   Wt 94 lb 9.6 oz (42.9 kg)   BMI 19.11 kg/m  Gen: NAD, alert, cooperative with exam HEENT: NCAT CV: RRR, good S1/S2, no murmur Resp: CTABL, no wheezes, non-labored Ext: No edema, warm Neuro: Alert and oriented, No gross deficits  Assessment and plan:  #CAD Asymptomatic Continue statin, beta-blocker, aspirin Labs  #Hyperlipidemia LDL goal less than 70 considering CAD Continue statin, fasting labs today  #Tobacco use disorder Counseled, has cut back but has not quit yet  #Need for hep C screening- screening    Orders Placed This Encounter  Procedures  . Hepatitis C antibody  . CBC with Differential/Platelet  . CMP14+EGFR  . Lipid panel     Laroy Apple, MD [facility] 06/17/2017, 8:05 AM

## 2017-06-18 LAB — CBC WITH DIFFERENTIAL/PLATELET
BASOS: 1 %
Basophils Absolute: 0 10*3/uL (ref 0.0–0.2)
EOS (ABSOLUTE): 0.2 10*3/uL (ref 0.0–0.4)
Eos: 3 %
HEMOGLOBIN: 13 g/dL (ref 11.1–15.9)
Hematocrit: 39.2 % (ref 34.0–46.6)
IMMATURE GRANS (ABS): 0 10*3/uL (ref 0.0–0.1)
Immature Granulocytes: 0 %
LYMPHS ABS: 1.1 10*3/uL (ref 0.7–3.1)
LYMPHS: 22 %
MCH: 31.2 pg (ref 26.6–33.0)
MCHC: 33.2 g/dL (ref 31.5–35.7)
MCV: 94 fL (ref 79–97)
MONOCYTES: 11 %
Monocytes Absolute: 0.5 10*3/uL (ref 0.1–0.9)
NEUTROS ABS: 3.1 10*3/uL (ref 1.4–7.0)
Neutrophils: 63 %
Platelets: 182 10*3/uL (ref 150–379)
RBC: 4.17 x10E6/uL (ref 3.77–5.28)
RDW: 12.9 % (ref 12.3–15.4)
WBC: 4.9 10*3/uL (ref 3.4–10.8)

## 2017-06-18 LAB — CMP14+EGFR
A/G RATIO: 2.5 — AB (ref 1.2–2.2)
ALK PHOS: 48 IU/L (ref 39–117)
ALT: 13 IU/L (ref 0–32)
AST: 23 IU/L (ref 0–40)
Albumin: 4.3 g/dL (ref 3.5–5.5)
BILIRUBIN TOTAL: 0.2 mg/dL (ref 0.0–1.2)
BUN / CREAT RATIO: 18 (ref 9–23)
BUN: 14 mg/dL (ref 6–24)
CHLORIDE: 106 mmol/L (ref 96–106)
CO2: 23 mmol/L (ref 20–29)
Calcium: 9.1 mg/dL (ref 8.7–10.2)
Creatinine, Ser: 0.8 mg/dL (ref 0.57–1.00)
GFR calc Af Amer: 93 mL/min/{1.73_m2} (ref >59)
GFR calc non Af Amer: 81 mL/min/{1.73_m2} (ref >59)
GLUCOSE: 76 mg/dL (ref 65–99)
Globulin, Total: 1.7 g/dL (ref 1.5–4.5)
POTASSIUM: 4.1 mmol/L (ref 3.5–5.2)
Sodium: 143 mmol/L (ref 134–144)
Total Protein: 6 g/dL (ref 6.0–8.5)

## 2017-06-18 LAB — LIPID PANEL
CHOLESTEROL TOTAL: 113 mg/dL (ref 100–199)
Chol/HDL Ratio: 2.1 ratio (ref 0.0–4.4)
HDL: 53 mg/dL (ref >39)
LDL Calculated: 45 mg/dL (ref 0–99)
Triglycerides: 75 mg/dL (ref 0–149)
VLDL CHOLESTEROL CAL: 15 mg/dL (ref 5–40)

## 2017-06-18 LAB — HEPATITIS C ANTIBODY: HEP C VIRUS AB: 0.2 {s_co_ratio} (ref 0.0–0.9)

## 2017-10-21 ENCOUNTER — Ambulatory Visit: Payer: BLUE CROSS/BLUE SHIELD | Admitting: Family Medicine

## 2017-10-21 ENCOUNTER — Encounter: Payer: Self-pay | Admitting: Family Medicine

## 2017-10-21 VITALS — BP 120/68 | HR 63 | Temp 98.1°F | Ht 59.0 in | Wt 92.0 lb

## 2017-10-21 DIAGNOSIS — E559 Vitamin D deficiency, unspecified: Secondary | ICD-10-CM

## 2017-10-21 DIAGNOSIS — F17200 Nicotine dependence, unspecified, uncomplicated: Secondary | ICD-10-CM | POA: Diagnosis not present

## 2017-10-21 DIAGNOSIS — E441 Mild protein-calorie malnutrition: Secondary | ICD-10-CM | POA: Diagnosis not present

## 2017-10-21 DIAGNOSIS — Z23 Encounter for immunization: Secondary | ICD-10-CM

## 2017-10-21 DIAGNOSIS — E785 Hyperlipidemia, unspecified: Secondary | ICD-10-CM

## 2017-10-21 DIAGNOSIS — Z114 Encounter for screening for human immunodeficiency virus [HIV]: Secondary | ICD-10-CM | POA: Diagnosis not present

## 2017-10-21 DIAGNOSIS — M8580 Other specified disorders of bone density and structure, unspecified site: Secondary | ICD-10-CM

## 2017-10-21 DIAGNOSIS — I2510 Atherosclerotic heart disease of native coronary artery without angina pectoris: Secondary | ICD-10-CM | POA: Diagnosis not present

## 2017-10-21 MED ORDER — ATORVASTATIN CALCIUM 20 MG PO TABS: 20.0000 mg | ORAL_TABLET | Freq: Every day | ORAL | 3 refills | Status: DC

## 2017-10-21 MED ORDER — BISOPROLOL FUMARATE 5 MG PO TABS: 2.5000 mg | ORAL_TABLET | Freq: Every day | ORAL | 3 refills | Status: DC

## 2017-10-21 MED ORDER — ZONISAMIDE 100 MG PO CAPS: 200.0000 mg | ORAL_CAPSULE | Freq: Every day | ORAL | 3 refills | Status: DC

## 2017-10-21 NOTE — Addendum Note (Signed)
Addended byDory Peru: RINTELMANN, GINA C on: 10/21/2017 04:54 PM   Modules accepted: Orders

## 2017-10-21 NOTE — Patient Instructions (Addendum)
Make sure to call Dr Herbie BaltimoreHarding for a follow up appointment.  You had labs performed today.  You will be contacted with the results of the labs once they are available, usually in the next 3 business days for routine lab work.   Schedule your mammogram. I have also ordered a bone density test.  We will have our flu shots at the end of the month and you may schedule with a nurse to have one administered starting October 1.

## 2017-10-21 NOTE — Progress Notes (Signed)
Subjective: CC: HLD/ CAD PCP: Janora Norlander, DO TKZ:SWFUX B Reuter is a 57 y.o. female presenting to clinic today for:  1.  Hyperlipidemia Patient is currently treated with Lipitor 20 mg.  LDL goal is less than 70.  She has known CAD as well.  She is followed by Dr. Ellyn Hack, cardiology.  Last office visit was September 2018.  She reports compliance with Lipitor.  Denies any chest pain, shortness of breath.  2. Coronary artery disease Noted to be nonobstructive moderate disease in the RCA and circumflex.  Blood pressure limits advancing therapy.  She is currently on a low-dose beta-blocker, aspirin and low-dose statin as above.  She reports compliance with all of her medications.  No chest pain, shortness of breath.  She does occasionally have lower extremity edema that occurs after she has been standing all day.  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  3. Tobacco use disorder She continues to smoke.  She states that she smokes about half a pack per day.  This is down from 3 packs/day.  She started smoking less than 1 pack/day about 1 year ago after this was addressed by her cardiologist.  She has been a smoker since age 83.  No hemoptysis, shortness of breath.  4.  Malnourishment Reports that despite low weight, she is actually gained about 4 pounds.  Lowest weight was 88 pounds.  She has been drinking 1 can of Ensure daily.  She notes that her appetite is poor, particularly while working around feet so much in the caf.   ROS: Per HPI  Allergies  Allergen Reactions  . Codeine   . Hydrocodone   . Robitussin Cough-Cold D [Pseudoephedrine-Dm-Gg]    Past Medical History:  Diagnosis Date  . Arthritis   . Coronary artery disease, non-occlusive 07/2016   65% d RCA (FFR 0.88), ~60% small mCx (~2 mm). normal EF.  Marland Kitchen Gastroparesis   . Insomnia   . Migraine   . Osteopenia   . Postmenopausal   . Tobacco use  disorder   . Vitamin D deficiency   . Weight loss     Current Outpatient Medications:  .  albuterol (PROVENTIL HFA;VENTOLIN HFA) 108 (90 Base) MCG/ACT inhaler, Inhale 2 puffs into the lungs every 6 (six) hours as needed for wheezing., Disp: 1 Inhaler, Rfl: 2 .  aspirin EC 81 MG tablet, Take 81 mg by mouth daily., Disp: , Rfl:  .  atorvastatin (LIPITOR) 20 MG tablet, TAKE 1 TABLET BY MOUTH ONCE DAILY, Disp: 30 tablet, Rfl: 9 .  bisoprolol (ZEBETA) 5 MG tablet, Take 0.5 tablets (2.5 mg total) by mouth daily., Disp: 90 tablet, Rfl: 1 .  Cholecalciferol (VITAMIN D) 2000 units CAPS, Take 1 capsule by mouth daily. 3 times a week, Disp: , Rfl:  .  zonisamide (ZONEGRAN) 100 MG capsule, Take 2 capsules (200 mg total) by mouth daily., Disp: 180 capsule, Rfl: 3 Social History   Socioeconomic History  . Marital status: Legally Separated    Spouse name: Not on file  . Number of children: 6  . Years of education: Not on file  . Highest education level: Not on file  Occupational History  . 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 Financial resource strain: Not on file  . Food insecurity:    Worry: Not on file    Inability: Not on  file  . Transportation needs:    Medical: Not on file    Non-medical: Not on file  Tobacco Use  . Smoking status: Current Every Day Smoker    Packs/day: 0.50    Types: Cigarettes  . Smokeless tobacco: Never Used  . Tobacco comment: form given 05-30-13  Substance and Sexual Activity  . Alcohol use: No  . Drug use: No  . Sexual activity: Not on file  Lifestyle  . Physical activity:    Days per week: Not on file    Minutes per session: Not on file  . Stress: Not on file  Relationships  . Social connections:    Talks on phone: Not on file    Gets together: Not on file    Attends religious service: Not on file    Active member of club or organization: Not on file    Attends meetings of clubs or organizations: Not on file      Relationship status: Not on file  . Intimate partner violence:    Fear of current or ex partner: Not on file    Emotionally abused: Not on file    Physically abused: Not on file    Forced sexual activity: Not on file  Other Topics Concern  . Not on file  Social History Narrative  . Not on file   Family History  Problem Relation Age of Onset  . Diabetes Mother   . Lung cancer Father   . Heart attack Brother 35  . Other Brother        Triple Bypass  . Heart attack Brother   . Breast cancer Paternal Grandmother   . Skin cancer Maternal Grandmother   . Diabetes Maternal Grandfather   . Heart attack Sister   . Other Sister        blocked arteries  . Hypertension Son   . Hypertension Son   . Colon cancer Neg Hx   . Throat cancer Neg Hx   . Stomach cancer Neg Hx   . Liver disease Neg Hx   . Kidney disease Neg Hx     Objective: Office vital signs reviewed. BP 120/68   Pulse 63   Temp 98.1 F (36.7 C) (Oral)   Ht '4\' 11"'$  (1.499 m)   Wt 92 lb (41.7 kg)   BMI 18.58 kg/m   Physical Examination:  General: Awake, alert, thin female, No acute distress HEENT: sclera white, MMM Cardio: regular rate and rhythm, S1S2 heard, no murmurs appreciated Pulm: clear to auscultation bilaterally, no wheezes, rhonchi or rales; normal work of breathing on room air Extremities: warm, well perfused, No edema, cyanosis or clubbing; +2 pulses bilaterally  Assessment/ Plan: 57 y.o. female   1. Coronary artery disease, non-occlusive I recommended that she proceed with scheduling a follow-up appoint with her cardiologist as directed at last visit.  Her LDL at last visit was at target.  Continue current medication regimen.  No changes.  2. Hyperlipidemia with target low density lipoprotein (LDL) cholesterol less than 70 mg/dL  3. Tobacco use disorder Encourage smoking cessation.  She continues to smoke about half a pack per day.  4. Malnutrition of mild degree ([place]) I gave her a written  prescription for Ensure today.  We will check CMP, vitamin D level and obtain DEXA. - CMP14+EGFR - VITAMIN D 25 Hydroxy (Vit-D Deficiency, Fractures) - DG [facility] DEXA; Future  5. Screening for HIV without presence of risk factors - HIV antibody (with reflex)  6. Vitamin  D deficiency - VITAMIN D 25 Hydroxy (Vit-D Deficiency, Fractures) - DG [facility] DEXA; Future  7. Osteopenia, unspecified location - DG [facility] DEXA; Future   Orders Placed This Encounter  Procedures  . CMP14+EGFR  . VITAMIN D 25 Hydroxy (Vit-D Deficiency, Fractures)  . HIV antibody (with reflex)   Meds ordered this encounter  Medications  . atorvastatin (LIPITOR) 20 MG tablet    Sig: Take 1 tablet (20 mg total) by mouth daily.    Dispense:  90 tablet    Refill:  3    Please consider 90 day supplies to promote better adherence  . bisoprolol (ZEBETA) 5 MG tablet    Sig: Take 0.5 tablets (2.5 mg total) by mouth daily.    Dispense:  90 tablet    Refill:  3  . zonisamide (ZONEGRAN) 100 MG capsule    Sig: Take 2 capsules (200 mg total) by mouth daily.    Dispense:  180 capsule    Refill:  [place], [place] [phone]

## 2017-10-27 LAB — CMP14+EGFR
A/G RATIO: 2.6 — AB (ref 1.2–2.2)
ALBUMIN: 4.5 g/dL (ref 3.5–5.5)
ALT: 19 IU/L (ref 0–32)
AST: 24 IU/L (ref 0–40)
Alkaline Phosphatase: 50 IU/L (ref 39–117)
BUN/Creatinine Ratio: 19 (ref 9–23)
BUN: 18 mg/dL (ref 6–24)
CHLORIDE: 105 mmol/L (ref 96–106)
CO2: 21 mmol/L (ref 20–29)
Calcium: 9.6 mg/dL (ref 8.7–10.2)
Creatinine, Ser: 0.96 mg/dL (ref 0.57–1.00)
GFR, EST AFRICAN AMERICAN: 75 mL/min/{1.73_m2} (ref >59)
GFR, EST NON AFRICAN AMERICAN: 65 mL/min/{1.73_m2} (ref >59)
GLUCOSE: 86 mg/dL (ref 65–99)
Globulin, Total: 1.7 g/dL (ref 1.5–4.5)
POTASSIUM: 4 mmol/L (ref 3.5–5.2)
SODIUM: 143 mmol/L (ref 134–144)
Total Protein: 6.2 g/dL (ref 6.0–8.5)

## 2017-10-27 LAB — HIV 1/2 AB DIFFERENTIATION
HIV 1 Ab: NEGATIVE
HIV 2 Ab: UNDETERMINED — AB

## 2017-10-27 LAB — RNA QUALITATIVE

## 2017-10-27 LAB — HIV ANTIBODY (ROUTINE TESTING W REFLEX): HIV Screen 4th Generation wRfx: REACTIVE — AB

## 2017-10-28 ENCOUNTER — Telehealth: Payer: Self-pay | Admitting: Family Medicine

## 2017-10-31 NOTE — Telephone Encounter (Signed)
Please review labs from 9/13 and advise.

## 2017-10-31 NOTE — Telephone Encounter (Signed)
Calling back to get lab results °

## 2017-11-01 ENCOUNTER — Other Ambulatory Visit: Payer: Self-pay | Admitting: Family Medicine

## 2017-11-01 ENCOUNTER — Telehealth: Payer: Self-pay | Admitting: Family Medicine

## 2017-11-01 DIAGNOSIS — Z21 Asymptomatic human immunodeficiency virus [HIV] infection status: Secondary | ICD-10-CM

## 2017-11-01 DIAGNOSIS — Z114 Encounter for screening for human immunodeficiency virus [HIV]: Secondary | ICD-10-CM

## 2017-11-01 NOTE — Telephone Encounter (Signed)
Spoke to patient.  Referral placed to ID w/ Dr Daiva EvesVan Dam.

## 2017-11-01 NOTE — Telephone Encounter (Signed)
Referral placed.

## 2017-11-01 NOTE — Telephone Encounter (Signed)
Attempted to call this am with results.  Please send her to my phone if she calls back so I can discuss results with her.

## 2017-11-01 NOTE — Telephone Encounter (Signed)
Received message from provider and pt HIV test flaggin in Epic alert in box that goes to myself and Dr. Ninetta LightsHatcher this is a HIV 2 indeterminate result  Could reflect early HIV 1 infection --> can answer this with HIV 1 viral load quantitative  Could reflect early HIV 2 infection but if she has not had sexual network that extends into [place] that would be very unlikely  COuld be a false +  We are happy to help with further testing in our cilnic if this would help?

## 2017-11-01 NOTE — Progress Notes (Signed)
I reviewed patient's labs, including reactive HIV screening test.  This was an indeterminant reflex test for HIV 2.  I have actually discussed this finding with infectious disease doctor, Dr. Daiva EvesVan Dam.  Patient with no past medical history of IV drug use or known HIV exposure.  She does report that she had a previous husband who was using illicit substances.  She is not sure if he used any IV drugs.  Otherwise, denies any possible exposures or recent travel/contact with persons from [place].  We discussed that this very well may be a false positive but does warrant further investigation.  Patient is amenable to further investigation.  I have placed a referral to infectious disease.  She will be contacted with an appointment.  She will contact her office if she does not hear for an appointment within the next 48 hours.  Ashly M. Nadine CountsGottschalk, DO [facility]

## 2017-11-04 NOTE — Progress Notes (Signed)
Ok that sounds quite reasonable thanks so much

## 2017-11-07 ENCOUNTER — Ambulatory Visit: Payer: BLUE CROSS/BLUE SHIELD | Admitting: Internal Medicine

## 2017-11-07 ENCOUNTER — Encounter: Payer: Self-pay | Admitting: Internal Medicine

## 2017-11-07 DIAGNOSIS — R75 Inconclusive laboratory evidence of human immunodeficiency virus [HIV]: Secondary | ICD-10-CM

## 2017-11-07 NOTE — Progress Notes (Signed)
[facility]      Reason for Consult: Indeterminate HIV-2 assay    Referring Physician: Dr. Nadine Counts    Patient ID: Susan Fischer, female    DOB: [birth year], 57 y.o.   MRN: [id number]  HPI:   She recently underwent evaluation for some weight loss and HIV to assay was indeterminate.  Her HIV one was negative.  She is never lived in [place], no recent significant risk factors.  Her only potential risk factor is her husband who was using IV drugs however she was not sexually active with him for over 14 years.  Otherwise no sexual activity with anyone else either.  Recent weight loss attributed to poor appetite and she is gaining some back.  No recent lymphadenopathy.  No rashes, myalgias or arthralgias. Previous record reviewed from her PCP and no other concerns.  Past Medical History:  Diagnosis Date  . Arthritis   . Coronary artery disease, non-occlusive 07/2016   65% d RCA (FFR 0.88), ~60% small mCx (~2 mm). normal EF.  Marland Kitchen Gastroparesis   . Insomnia   . Migraine   . Osteopenia   . Postmenopausal   . Tobacco use disorder   . Vitamin D deficiency   . Weight loss     Prior to Admission medications   Medication Sig Start Date End Date Taking? Authorizing Provider  albuterol (PROVENTIL HFA;VENTOLIN HFA) 108 (90 Base) MCG/ACT inhaler Inhale 2 puffs into the lungs every 6 (six) hours as needed for wheezing. 02/17/17  Yes Elenora Gamma, MD  aspirin EC 81 MG tablet Take 81 mg by mouth daily.   Yes [provider]  atorvastatin (LIPITOR) 20 MG tablet Take 1 tablet (20 mg total) by mouth daily. 10/21/17  Yes Gottschalk, Kathie Rhodes M, DO  bisoprolol (ZEBETA) 5 MG tablet Take 0.5 tablets (2.5 mg total) by mouth daily. 10/21/17  Yes Delynn Flavin M, DO  Cholecalciferol (VITAMIN D) 2000 units CAPS Take 1 capsule by mouth daily. 3 times a week   Yes [provider]  zonisamide (ZONEGRAN) 100 MG capsule Take 2 capsules (200 mg total) by mouth  daily. 10/21/17  Yes Delynn Flavin M, DO    Allergies  Allergen Reactions  . Codeine   . Hydrocodone   . Robitussin Cough-Cold D [Pseudoephedrine-Dm-Gg]     Social History   Tobacco Use  . Smoking status: Current Every Day Smoker    Packs/day: 0.50    Types: Cigarettes  . Smokeless tobacco: Never Used  . Tobacco comment: form given 05-30-13  Substance Use Topics  . Alcohol use: No  . Drug use: No    Family History  Problem Relation Age of Onset  . Diabetes Mother   . Lung cancer Father   . Heart attack Brother 38  . Other Brother        Triple Bypass  . Heart attack Brother   . Breast cancer Paternal Grandmother   . Skin cancer Maternal Grandmother   . Diabetes Maternal Grandfather   . Heart attack Sister   . Other Sister        blocked arteries  . Hypertension Son   . Hypertension Son   . Colon cancer Neg Hx   . Throat cancer Neg Hx   . Stomach cancer Neg Hx   . Liver disease Neg Hx   . Kidney disease Neg Hx      Review of Systems  Constitutional: negative for sweats and fatigue Gastrointestinal: negative for  nausea and diarrhea Musculoskeletal: negative for myalgias and arthralgias All other systems reviewed and are negative    Constitutional: in no apparent distress and alert  Vitals:   11/07/17 0920  BP: (!) 150/67   EYES: anicteric ENMT: no thrush Cardiovascular: Cor RRR Respiratory: CTA B; normal respiratory effort GI: Bowel sounds are normal, liver is not enlarged, spleen is not enlarged Musculoskeletal: no pedal edema noted Skin: negatives: no rash Hematologic: no cervical or axillary lad  Labs: Lab Results  Component Value Date   WBC 4.9 06/17/2017   HGB 13.0 06/17/2017   HCT 39.2 06/17/2017   MCV 94 06/17/2017   PLT 182 06/17/2017    Lab Results  Component Value Date   CREATININE 0.96 10/21/2017   BUN 18 10/21/2017   NA 143 10/21/2017   K 4.0 10/21/2017   CL 105 10/21/2017   CO2 21 10/21/2017    Lab Results  Component  Value Date   ALT 19 10/21/2017   AST 24 10/21/2017   ALKPHOS 50 10/21/2017   BILITOT <0.2 10/21/2017   INR 1.0 07/16/2016     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  I discussed location of typical HIV-2 and not consistent with patients history.  Will repeat now and in 3 months.    Plan: 1) HIV 2) HIV DNA/RNA-2 3) rtc 3 months

## 2017-11-10 LAB — HIV-2 DNA/RNA, QUALITATIVE, REAL-TIME PCR: HIV 2 DNA/RNA QUALITATIVE REAL TIME PCR: NOT DETECTED

## 2017-11-10 LAB — HIV ANTIBODY (ROUTINE TESTING W REFLEX): HIV 1&2 Ab, 4th Generation: NONREACTIVE

## 2017-11-11 ENCOUNTER — Telehealth: Payer: Self-pay | Admitting: Behavioral Health

## 2017-11-11 NOTE — Telephone Encounter (Signed)
Patient returning call regarding lab results.  She was advised per Dr Luciana Axe her HIV -2 antibody was negative.   Patient understands results.  Laurell Josephs, RN

## 2017-11-11 NOTE — Telephone Encounter (Signed)
-----   Message from Gardiner Barefoot, MD sent at 11/11/2017  8:27 AM EDT ----- Please let her know her tests for HIV 2 are negative.

## 2017-11-11 NOTE — Telephone Encounter (Signed)
Called patient left generic message for her to call the office back.  Left call back number. Angeline Slim RN

## 2018-02-13 ENCOUNTER — Ambulatory Visit: Payer: BLUE CROSS/BLUE SHIELD | Admitting: Internal Medicine

## 2018-04-21 ENCOUNTER — Encounter: Payer: Self-pay | Admitting: Family Medicine

## 2018-04-21 ENCOUNTER — Other Ambulatory Visit: Payer: Self-pay

## 2018-04-21 ENCOUNTER — Ambulatory Visit: Payer: BLUE CROSS/BLUE SHIELD | Admitting: Family Medicine

## 2018-04-21 VITALS — BP 118/71 | HR 66 | Temp 98.1°F | Ht 59.0 in | Wt 101.0 lb

## 2018-04-21 DIAGNOSIS — I2510 Atherosclerotic heart disease of native coronary artery without angina pectoris: Secondary | ICD-10-CM

## 2018-04-21 DIAGNOSIS — Z6820 Body mass index (BMI) 20.0-20.9, adult: Secondary | ICD-10-CM | POA: Diagnosis not present

## 2018-04-21 DIAGNOSIS — F5101 Primary insomnia: Secondary | ICD-10-CM

## 2018-04-21 DIAGNOSIS — Z720 Tobacco use: Secondary | ICD-10-CM

## 2018-04-21 MED ORDER — SUVOREXANT 20 MG PO TABS: 20.0000 mg | ORAL_TABLET | Freq: Every evening | ORAL | 0 refills | Status: DC | PRN

## 2018-04-21 MED ORDER — SUVOREXANT 10 MG PO TABS: 10.0000 mg | ORAL_TABLET | Freq: Every evening | ORAL | 0 refills | Status: DC | PRN

## 2018-04-21 MED ORDER — SUVOREXANT 15 MG PO TABS: 15.0000 mg | ORAL_TABLET | Freq: Every evening | ORAL | 0 refills | Status: DC | PRN

## 2018-04-21 NOTE — Patient Instructions (Signed)
I have prescribed 3 different strengths of Belsomra, this is a sleep medication.  Start with a 10 mg every night at bedtime as needed for sleep.  This should gradually get you to sleep.  If this dose works well for you, contact me and we will extend it out a month and have you follow-up in 1 month for recheck.  If not, you may increase to the 15 mg dose.  Suvorexant oral tablets What is this medicine? SUVOREXANT (su-vor-EX-ant) is used to treat insomnia. This medicine helps you to fall asleep and sleep through the night. This medicine may be used for other purposes; ask your health care provider or pharmacist if you have questions. COMMON BRAND NAME(S): Belsomra What should I tell my health care provider before I take this medicine? They need to know if you have any of these conditions: -depression -history of a drug or alcohol abuse problem -history of daytime sleepiness -history of sudden onset of muscle weakness (cataplexy) -liver disease -lung or breathing disease -narcolepsy -suicidal thoughts, plans, or attempt; a previous suicide attempt by you or a family member -an unusual or allergic reaction to suvorexant, other medicines, foods, dyes, or preservatives -pregnant or trying to get pregnant -breast-feeding How should I use this medicine? Take this medicine by mouth within 30 minutes of going to bed. Do not take it unless you are able to stay in bed a full night before you must be active again. Follow the directions on the prescription label. For best results, it is better to take this medicine on an empty stomach. Do not take your medicine more often than directed. Do not stop taking this medicine on your own. Always follow your doctor or health care professional's advice. A special MedGuide will be given to you by the pharmacist with each prescription and refill. Be sure to read this information carefully each time. Talk to your pediatrician regarding the use of this medicine in  children. Special care may be needed. Overdosage: If you think you have taken too much of this medicine contact a poison control center or emergency room at once. NOTE: This medicine is only for you. Do not share this medicine with others. What if I miss a dose? This medicine should only be taken immediately before going to sleep. Do not take double or extra doses. What may interact with this medicine? -alcohol -antiviral medicines for HIV or AIDS -aprepitant -carbamazepine -certain antibiotics like ciprofloxacin, clarithromycin, erythromycin, telithromycin -certain medicines for depression or psychotic disturbances -certain medicines for fungal infections like ketoconazole, posaconazole, fluconazole, or itraconazole -conivaptan -digoxin -diltiazem -grapefruit juice -imatinib -medicines for anxiety or sleep -phenytoin -rifampin -verapamil This list may not describe all possible interactions. Give your health care provider a list of all the medicines, herbs, non-prescription drugs, or dietary supplements you use. Also tell them if you smoke, drink alcohol, or use illegal drugs. Some items may interact with your medicine. What should I watch for while using this medicine? Visit your doctor or health care professional for regular checks on your progress. Keep a regular sleep schedule by going to bed at about the same time each night. Avoid caffeine-containing drinks in the evening hours. When sleep medicines are used every night for more than a few weeks, they may stop working. Do not increase the dose on your own. Talk to your doctor if your insomnia worsens or is not better within 7 to 10 days. After taking this medicine, you may get up out of bed and do  an activity that you do not know you are doing. The next morning, you may have no memory of this. Activities include driving a car ("sleep-driving"), making and eating food, talking on the phone, sexual activity, and sleep-walking. Serious  injuries have occurred. Call your doctor right away if you find out you have done any of these activities. Do not take this medicine if you have used alcohol that evening. Do not take it if you have taken another medicine for sleep. The risk of doing these sleep-related activities is higher. Do not take this medicine unless you are able to stay in bed for a full night (7 to 8 hours) and do not drive or perform other activities requiring full alertness within 8 hours of a dose. Do not drive, use machinery, or do anything that needs mental alertness the day after you take the 20 mg dose of this medicine. The use of lower doses (10 mg) also has the potential to cause driving impairment the next day. You may have a decrease in mental alertness the day after use, even if you feel that you are fully awake. Tell your doctor if you will need to perform activities requiring full alertness, such as driving, the next day. Do not stand or sit up quickly after taking this medicine, especially if you are an older patient. This reduces the risk of dizzy or fainting spells. If you or your family notice any changes in your behavior, such as new or worsening depression, thoughts of harming yourself, anxiety, other unusual or disturbing thoughts, or memory loss, call your doctor right away. What side effects may I notice from receiving this medicine? Side effects that you should report to your doctor or health care professional as soon as possible: -allergic reactions like skin rash, itching or hives, swelling of the face, lips, or tongue -confusion -depressed mood -feeling faint or lightheaded, falls -hallucinations -inability to move or speak for up to several minutes while you are going to sleep or waking up -memory loss -periods of leg weakness lasting from seconds to a few minutes -problems with balance, speaking, walking -restlessness, excitability, or feelings of agitation -unusual activities while asleep like  driving, eating, making phone calls Side effects that usually do not require medical attention (report to your doctor or health care professional if they continue or are bothersome): -abnormal dreams -daytime drowsiness -diarrhea -dizziness -headache This list may not describe all possible side effects. Call your doctor for medical advice about side effects. You may report side effects to FDA at 1-800-FDA-1088. Where should I keep my medicine? Keep out of the reach of children. This medicine can be abused. Keep your medicine in a safe place to protect it from theft. Do not share this medicine with anyone. Selling or giving away this medicine is dangerous and against the law. Store at room temperature between 15 and 30 degrees C (59 and 86 degrees F). Throw away any unused medicine after the expiration date. NOTE: This sheet is a summary. It may not cover all possible information. If you have questions about this medicine, talk to your doctor, pharmacist, or health care provider.  2019 Elsevier/Gold Standard (2017-07-22 12:32:42)

## 2018-04-21 NOTE — Progress Notes (Signed)
Subjective: CC: HLD/ CAD PCP: Raliegh Ip, DO AST:MHDQQ B Dockett is a 58 y.o. female presenting to clinic today for:  1. Coronary artery disease History: Nonobstructive moderate disease in the RCA and circumflex.  Blood pressure limits advancing therapy.   Patient reports that she is doing well.  She reports compliance with all medications.  However, she does go on to note that she has not seen her cardiologist.  She does plan on following up with him soon.  Denies any chest pain, shortness of breath, lower extremity edema.  She continues to smoke daily about half pack per day but is motivated to come off of the cigarettes at some point.  No hemoptysis.  She is gaining weight, which she is thrilled about.  2.  Malnourishment She has been gaining weight since her last visit.  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  She continues to be involved in her church.  She was unable to get the Ensure because the insurance would not cover this but she has been increasing vegetables and protein in efforts to put on weight.  3.  Difficulty sleeping Patient reports longstanding history of difficulty with sleep.  She has failed various OTC remedies including melatonin, Unisom, Tylenol PM.  She reports good sleep hygiene and has reduced caffeine intake.  She frequently finds herself not being able to fall asleep.  She does report daytime fatigue as a result.   ROS: Per HPI  Allergies  Allergen Reactions  . Codeine   . Hydrocodone   . Robitussin Cough-Cold D [Pseudoephedrine-Dm-Gg]    Past Medical History:  Diagnosis Date  . Arthritis   . Coronary artery disease, non-occlusive 07/2016   65% d RCA (FFR 0.88), ~60% small mCx (~2 mm). normal EF.  Marland Kitchen Gastroparesis   . Insomnia   . Migraine   . Osteopenia   . Postmenopausal   . Tobacco use disorder   . Vitamin D deficiency   . Weight loss      Current Outpatient Medications:  .  albuterol (PROVENTIL HFA;VENTOLIN HFA) 108 (90 Base) MCG/ACT inhaler, Inhale 2 puffs into the lungs every 6 (six) hours as needed for wheezing., Disp: 1 Inhaler, Rfl: 2 .  aspirin EC 81 MG tablet, Take 81 mg by mouth daily., Disp: , Rfl:  .  atorvastatin (LIPITOR) 20 MG tablet, Take 1 tablet (20 mg total) by mouth daily., Disp: 90 tablet, Rfl: 3 .  bisoprolol (ZEBETA) 5 MG tablet, Take 0.5 tablets (2.5 mg total) by mouth daily., Disp: 90 tablet, Rfl: 3 .  Cholecalciferol (VITAMIN D) 2000 units CAPS, Take 1 capsule by mouth daily. 3 times a week, Disp: , Rfl:  .  zonisamide (ZONEGRAN) 100 MG capsule, Take 2 capsules (200 mg total) by mouth daily., Disp: 180 capsule, Rfl: 3 Social History   Socioeconomic History  . Marital status: Legally Separated    Spouse name: Not on file  . Number of children: 6  . Years of education: Not on file  . Highest education level: Not on file  Occupational History  . Occupation: custodian    Associate Professor: Audiological scientist  Social Needs  . Financial resource strain: Not on file  . Food insecurity:    Worry: Not on file    Inability: Not on file  . Transportation needs:    Medical: Not on file  Non-medical: Not on file  Tobacco Use  . Smoking status: Current Every Day Smoker    Packs/day: 0.50    Types: Cigarettes  . Smokeless tobacco: Never Used  . Tobacco comment: form given 05-30-13  Substance and Sexual Activity  . Alcohol use: No  . Drug use: No  . Sexual activity: Not on file  Lifestyle  . Physical activity:    Days per week: Not on file    Minutes per session: Not on file  . Stress: Not on file  Relationships  . Social connections:    Talks on phone: Not on file    Gets together: Not on file    Attends religious service: Not on file    Active member of club or organization: Not on file    Attends meetings of clubs or organizations: Not on file    Relationship status: Not on file  .  Intimate partner violence:    Fear of current or ex partner: Not on file    Emotionally abused: Not on file    Physically abused: Not on file    Forced sexual activity: Not on file  Other Topics Concern  . Not on file  Social History Narrative  . Not on file   Family History  Problem Relation Age of Onset  . Diabetes Mother   . Lung cancer Father   . Heart attack Brother 38  . Other Brother        Triple Bypass  . Heart attack Brother   . Breast cancer Paternal Grandmother   . Skin cancer Maternal Grandmother   . Diabetes Maternal Grandfather   . Heart attack Sister   . Other Sister        blocked arteries  . Hypertension Son   . Hypertension Son   . Colon cancer Neg Hx   . Throat cancer Neg Hx   . Stomach cancer Neg Hx   . Liver disease Neg Hx   . Kidney disease Neg Hx     Objective: Office vital signs reviewed. BP 118/71   Pulse 66   Temp 98.1 F (36.7 C) (Oral)   Ht 4\' 11"  (1.499 m)   Wt 101 lb (45.8 kg)   BMI 20.40 kg/m   Physical Examination:  General: Awake, alert, thin female, No acute distress HEENT: sclera white, MMM Cardio: regular rate and rhythm, S1S2 heard, no murmurs appreciated Pulm: clear to auscultation bilaterally, no wheezes, rhonchi or rales; normal work of breathing on room air Extremities: warm, well perfused, No edema, cyanosis or clubbing; +2 pulses bilaterally Psych: mood stable, speech normal. Affect appropriate. Depression screen [facility] 2/9 04/21/2018 11/07/2017 10/21/2017 06/17/2017 02/17/2017  Decreased Interest 0 0 0 0 0  Down, Depressed, Hopeless 0 0 0 0 0  PHQ - 2 Score 0 0 0 0 0  Altered sleeping 2 - - - -  Tired, decreased energy 0 - - - -  Change in appetite 0 - - - -  Feeling bad or failure about yourself  0 - - - -  Trouble concentrating 0 - - - -  Moving slowly or fidgety/restless 0 - - - -  Suicidal thoughts 0 - - - -  PHQ-9 Score 2 - - - -   Assessment/ Plan: 58 y.o. female   1. Coronary artery disease, non-occlusive  I have advised her to follow-up with her cardiology as recommended previously.  Today, she seems to be doing really well.  Her weight is now within  a normal BMI range.  She does not need refills on any medications.  She will follow-up in 6 months, sooner if needed  2. Primary insomnia Failing multiple OTC remedies.  Does not fit the body habitus of sleep apnea nor she endorsing any sleep apneic symptoms.  This seems to be her primary insomnia.  I have given her a trial of 10, 15 and 20 mg Belsomra.  A coupon could also provided.  We discussed starting at the 10 mg dose.  If the 10 mg dose works well for her, she does not need to pick up the other 2 remaining doses and she can contact me and I will send in a 30-day supply of the 10 mg.  She will follow-up in 1 month for reevaluation.  We discussed the possible side effects of the medication and a handout was provided.  Sleep hygiene handout also provided. The Narcotic Database has been reviewed.  There were no red flags.    - Suvorexant (BELSOMRA) 10 MG TABS; Take 10 mg by mouth at bedtime as needed (sleep).  Dispense: 10 tablet; Refill: 0 - Suvorexant (BELSOMRA) 15 MG TABS; Take 15 mg by mouth at bedtime as needed (sleep).  Dispense: 10 tablet; Refill: 0 - Suvorexant (BELSOMRA) 20 MG TABS; Take 20 mg by mouth at bedtime as needed (sleep).  Dispense: 10 tablet; Refill: 0  3. BMI 20.0-20.9, adult Has gained 10 pounds since her last visit.  She is doing well.  4. Tobacco use Contemplative. Counseling performed.  Will continue to counsel with each visit.  No orders of the defined types were placed in this encounter.  Meds ordered this encounter  Medications  . Suvorexant (BELSOMRA) 10 MG TABS    Sig: Take 10 mg by mouth at bedtime as needed (sleep).    Dispense:  10 tablet    Refill:  0  . Suvorexant (BELSOMRA) 15 MG TABS    Sig: Take 15 mg by mouth at bedtime as needed (sleep).    Dispense:  10 tablet    Refill:  0  . Suvorexant (BELSOMRA)  20 MG TABS    Sig: Take 20 mg by mouth at bedtime as needed (sleep).    Dispense:  10 tablet    Refill:  0     Ashly Hulen Skains, DO [facility] [phone]

## 2018-05-29 ENCOUNTER — Ambulatory Visit: Payer: BLUE CROSS/BLUE SHIELD | Admitting: Family Medicine

## 2018-11-28 ENCOUNTER — Encounter: Payer: Self-pay | Admitting: Physician Assistant

## 2018-11-28 ENCOUNTER — Telehealth: Payer: Self-pay

## 2018-11-28 ENCOUNTER — Telehealth (INDEPENDENT_AMBULATORY_CARE_PROVIDER_SITE_OTHER): Payer: BC Managed Care – PPO | Admitting: Physician Assistant

## 2018-11-28 VITALS — BP 112/71 | HR 64 | Ht 59.0 in | Wt 100.0 lb

## 2018-11-28 DIAGNOSIS — E46 Unspecified protein-calorie malnutrition: Secondary | ICD-10-CM

## 2018-11-28 DIAGNOSIS — I959 Hypotension, unspecified: Secondary | ICD-10-CM

## 2018-11-28 DIAGNOSIS — E785 Hyperlipidemia, unspecified: Secondary | ICD-10-CM

## 2018-11-28 DIAGNOSIS — Z720 Tobacco use: Secondary | ICD-10-CM

## 2018-11-28 DIAGNOSIS — I2510 Atherosclerotic heart disease of native coronary artery without angina pectoris: Secondary | ICD-10-CM

## 2018-11-28 DIAGNOSIS — G4700 Insomnia, unspecified: Secondary | ICD-10-CM

## 2018-11-28 NOTE — Progress Notes (Signed)
Virtual Visit via Telephone Note   This visit type was conducted due to national recommendations for restrictions regarding the COVID-19 Pandemic (e.g. social distancing) in an effort to limit this patient's exposure and mitigate transmission in our community.  Due to her co-morbid illnesses, this patient is at least at moderate risk for complications without adequate follow up.  This format is felt to be most appropriate for this patient at this time.  The patient did not have access to video technology/had technical difficulties with video requiring transitioning to audio format only (telephone).  All issues noted in this document were discussed and addressed.  No physical exam could be performed with this format.  Please refer to the patient's chart for her  consent to telehealth for [facility].   Date:  11/28/2018   ID:  Susan Fischer, DOB February 13, 1960, MRN [id number]  Patient Location: Home Provider Location: Home  PCP:  Janora Norlander, DO  Cardiologist:  Glenetta Hew, MD  Electrophysiologist:  None   Evaluation Performed:  Follow-Up Visit  Chief Complaint:  Annual visit  History of Present Illness:    Susan Fischer is a 58 y.o. female with PMH of CAD.  She was initially referred for exertional shortness of breath and chest pain.  She does have significant family history of early CAD in her brother had first MI in his 75s.  She has been a heavy smoker since age 51.  Given her exertional symptoms, we set her up for cardiac catheterization in June 2018 which showed 60% left circumflex lesion with negative FFR, aggressive medical therapy was recommended.  Her last office visit with Dr. Ellyn Hack was in September 2018 at which time she was doing well.  Patient presents today for telephone visit.  She denies any recent exertional symptoms or shortness of breath.  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  She says she is quite  active without any chest pain.  She has cut back smoking to half a pack a day and continue to work on quitting.  She has gained some weight and is currently maintaining a weight of 100 pound.  She also uses Ensure to make sure she gets enough nutrition.  The patient does not have symptoms concerning for COVID-19 infection (fever, chills, cough, or new shortness of breath).    Past Medical History:  Diagnosis Date  . Anorexia 12/11/2012  . Arthritis   . Coronary artery disease, non-occlusive 07/2016   65% d RCA (FFR 0.88), ~60% small mCx (~2 mm). normal EF.  Marland Kitchen Gastroparesis   . Insomnia   . Migraine   . Osteopenia   . Postmenopausal   . Tobacco use disorder   . Vitamin D deficiency   . Weight loss    Past Surgical History:  Procedure Laterality Date  . ABDOMINAL HYSTERECTOMY    . COLONOSCOPY  2011   [place]  . INTRAVASCULAR PRESSURE WIRE/FFR STUDY N/A 07/21/2016   Procedure: Intravascular Pressure Wire/FFR Study;  Surgeon: Leonie Man, MD;  Location: [facility];  Service: Cardiovascular;  Laterality: RCA - FFR 0.88 - Not Significant  . LEFT HEART CATH AND CORONARY ANGIOGRAPHY N/A 07/21/2016   Procedure: Left Heart Cath and Coronary Angiography;  Surgeon: Leonie Man, MD;  Location: [facility]:  pRCA ~30%, dRCA 65% (FFR 0.88), mCx (small ~48mm) ~60%.  EF 55-65%. Normal LVEDP     Current Meds  Medication Sig  .  albuterol (PROVENTIL HFA;VENTOLIN HFA) 108 (90 Base) MCG/ACT inhaler Inhale 2 puffs into the lungs every 6 (six) hours as needed for wheezing.  Marland Kitchen. aspirin EC 81 MG tablet Take 81 mg by mouth daily.  Marland Kitchen. atorvastatin (LIPITOR) 20 MG tablet Take 1 tablet (20 mg total) by mouth daily.  . bisoprolol (ZEBETA) 5 MG tablet Take 0.5 tablets (2.5 mg total) by mouth daily.  . Cholecalciferol (VITAMIN D) 2000 units CAPS Take 1 capsule by mouth daily. 1 times a week  . zonisamide (ZONEGRAN) 100 MG capsule Take 2 capsules (200 mg total) by mouth daily.     Allergies:    Codeine, Hydrocodone, and Robitussin cough-cold d [pseudoephedrine-[id number]]   Social History   Tobacco Use  . Smoking status: Current Every Day Smoker    Packs/day: 0.50    Types: Cigarettes  . Smokeless tobacco: Never Used  . Tobacco comment: form given 05-30-13  Substance Use Topics  . Alcohol use: No  . Drug use: No     Family Hx: The patient's family history includes Breast cancer in her paternal grandmother; Diabetes in her maternal grandfather and mother; Heart attack in her brother and sister; Heart attack (age of onset: 1838) in her brother; Hypertension in her son and son; Lung cancer in her father; Other in her brother and sister; Skin cancer in her maternal grandmother. There is no history of Colon cancer, Throat cancer, Stomach cancer, Liver disease, or Kidney disease.  ROS:   Please see the history of present illness.     All other systems reviewed and are negative.   Prior CV studies:   The following studies were reviewed today:  Cath 07/21/2016  Prox RCA lesion, 30 %stenosed. Dist RCA lesion, 65 %stenosed. - Tandem lesions evaluated with FFR - 0.88. Physiologically not significant.  Mid Cx lesion, 60 %stenosed. - There are some bruises with this appears to be maybe more significant, others look minimal. The vessel itself is roughly 2 mm at maximum diameter. Not a great PCI target  The left ventricular systolic function is normal. The left ventricular ejection fraction is 55-65% by visual estimate.  LV end diastolic pressure is normal.   Patient does have moderate disease in the RCA and circumflex, neither appeared to be intragraft severe. The RCA lesion appeared to be slightly more significant and large vessel. I evaluated this with FFR which was negative for ischemia.  The circumflex lesion and some images appears to be 20-30 % and in other images appears to be 60-70%.   Surprisingly no obvious culprit lesion to explain the patient's symptoms. We'll continue to  monitor symptomatically. If symptoms persist, we would potentially consider a Myoview stress test to evaluate for inferolateral ischemia from the circumflex lesion.  Plan: With existing moderate coronary disease, would continue aggressive risk factor management. She'll be discharged home from short stay today and seen in follow-up as scheduled.  Labs/Other Tests and Data Reviewed:    EKG:  An ECG dated 08/15/2016 was personally reviewed today and demonstrated:  Normal sinus rhythm without significant ST-T wave changes  Recent Labs: No results found for requested labs within last 8760 hours.   Recent Lipid Panel Lab Results  Component Value Date/Time   CHOL 113 06/17/2017 08:18 AM   TRIG 75 06/17/2017 08:18 AM   HDL 53 06/17/2017 08:18 AM   CHOLHDL 2.1 06/17/2017 08:18 AM   LDLCALC 45 06/17/2017 08:18 AM    Wt Readings from Last 3 Encounters:  11/28/18 100 lb (45.4 kg)  04/21/18 101 lb (45.8 kg)  11/07/17 91 lb (41.3 kg)     Objective:    Vital Signs:  BP 112/71   Pulse 64   Ht 4\' 11"  (1.499 m)   Wt 100 lb (45.4 kg)   BMI 20.20 kg/m    VITAL SIGNS:  reviewed  ASSESSMENT & PLAN:    1. CAD: Denies any exertional chest discomfort or shortness of breath.  Continue aspirin, statin and beta-blocker  2. Hypotension: Although her blood pressure is normal during the day, however she continued to have episodes of hypotension at night.  She is aware that she may drink a low bit more water at night to help maintain blood pressure.  She does not have significant symptom while her blood pressure is low.  In the future, if her hypotension is causing more symptoms, will need to stop bisoprolol.  3. Hyperlipidemia: We will defer annual lipid panel to PCP.  Last lipid panel obtained in 2019 showed very well-controlled cholesterol.  4. Tobacco abuse: We will defer lung cancer screening to PCP.  She has been smoking since age 82, currently cut back to half a pack per day.  We discussed the  importance of tobacco cessation and relationship between tobacco abuse and coronary artery disease.  5. Malnutrition: She is currently maintaining her weight.  6. Insomnia: She did not wish to take the prescription sleep medications her PCP gave her.  I recommended some of the over-the-counter medication such as Benadryl.  She has tried melatonin without success.   COVID-19 Education: The signs and symptoms of COVID-19 were discussed with the patient and how to seek care for testing (follow up with PCP or arrange E-visit).  The importance of social distancing was discussed today.  Time:   Today, I have spent 11 minutes with the patient with telehealth technology discussing the above problems.     Medication Adjustments/Labs and Tests Ordered: Current medicines are reviewed at length with the patient today.  Concerns regarding medicines are outlined above.   Tests Ordered: No orders of the defined types were placed in this encounter.   Medication Changes: No orders of the defined types were placed in this encounter.   Follow Up:  In Person in 1 year(s)  Signed, 18, Azalee Course  11/28/2018 8:08 AM    [facility]

## 2018-11-28 NOTE — Patient Instructions (Addendum)
Medication Instructions:   Your physician recommends that you continue on your current medications as directed. Please refer to the Current Medication list given to you today.  *If you need a refill on your cardiac medications before your next appointment, please call your pharmacy*  Lab Work:  NONE ordered at this time of appointment   If you have labs (blood work) drawn today and your tests are completely normal, you will receive your results only by: Marland Kitchen MyChart Message (if you have MyChart) OR . A paper copy in the mail If you have any lab test that is abnormal or we need to change your treatment, we will call you to review the results.  Testing/Procedures:  NONE ordered at this time of appointment   Follow-Up: At [facility], you and your health needs are our priority.  As part of our continuing mission to provide you with exceptional heart care, we have created designated Provider Care Teams.  These Care Teams include your primary Cardiologist (physician) and Advanced Practice Providers (APPs -  Physician Assistants and Nurse Practitioners) who all work together to provide you with the care you need, when you need it.  Your next appointment:   Will be in 12 months-October 2021. Please call our office in July 2021 and/or August 2021 to get scheduled    The format for your next appointment:   In person with   Provider:   Glenetta Hew, MD  Other Instructions

## 2018-11-28 NOTE — Telephone Encounter (Signed)

## 2019-03-13 ENCOUNTER — Other Ambulatory Visit: Payer: Self-pay | Admitting: Family Medicine

## 2019-04-16 ENCOUNTER — Other Ambulatory Visit: Payer: Self-pay

## 2019-04-18 ENCOUNTER — Encounter: Payer: Self-pay | Admitting: Family Medicine

## 2019-04-18 ENCOUNTER — Ambulatory Visit (INDEPENDENT_AMBULATORY_CARE_PROVIDER_SITE_OTHER): Payer: Self-pay | Admitting: Family Medicine

## 2019-04-18 ENCOUNTER — Other Ambulatory Visit: Payer: Self-pay

## 2019-04-18 VITALS — BP 124/80 | HR 68 | Temp 96.9°F | Ht 59.0 in | Wt 108.0 lb

## 2019-04-18 DIAGNOSIS — M8580 Other specified disorders of bone density and structure, unspecified site: Secondary | ICD-10-CM

## 2019-04-18 DIAGNOSIS — M19042 Primary osteoarthritis, left hand: Secondary | ICD-10-CM

## 2019-04-18 DIAGNOSIS — K144 Atrophy of tongue papillae: Secondary | ICD-10-CM

## 2019-04-18 DIAGNOSIS — Z130 Encounter for screening for diseases of the blood and blood-forming organs and certain disorders involving the immune mechanism: Secondary | ICD-10-CM

## 2019-04-18 DIAGNOSIS — G43009 Migraine without aura, not intractable, without status migrainosus: Secondary | ICD-10-CM

## 2019-04-18 DIAGNOSIS — M19041 Primary osteoarthritis, right hand: Secondary | ICD-10-CM

## 2019-04-18 DIAGNOSIS — E785 Hyperlipidemia, unspecified: Secondary | ICD-10-CM

## 2019-04-18 DIAGNOSIS — I2510 Atherosclerotic heart disease of native coronary artery without angina pectoris: Secondary | ICD-10-CM

## 2019-04-18 MED ORDER — ATORVASTATIN CALCIUM 20 MG PO TABS: 20.0000 mg | ORAL_TABLET | Freq: Every day | ORAL | 3 refills | Status: DC

## 2019-04-18 MED ORDER — BISOPROLOL FUMARATE 5 MG PO TABS: 2.5000 mg | ORAL_TABLET | Freq: Every day | ORAL | 3 refills | Status: DC

## 2019-04-18 MED ORDER — ZONISAMIDE 100 MG PO CAPS: 200.0000 mg | ORAL_CAPSULE | Freq: Every day | ORAL | 3 refills | Status: DC

## 2019-04-18 NOTE — Progress Notes (Signed)
Subjective: CC: HLD/ CAD PCP: Janora Norlander, DO Susan Fischer is a 59 y.o. female presenting to clinic today for:  1. Coronary artery disease History: Nonobstructive moderate disease in the RCA and circumflex.  Blood pressure limits advancing therapy.   Patient reports that she is doing well.  She is compliant with her bisoprolol twice daily, Lipitor 20 mg daily and aspirin 81 mg daily.  Denies any chest pain, shortness of breath, dizziness, lower extremity edema, change in exercise tolerance.  She tries to stay physically active and is currently working in the [employer].  She is had some arthritic pain in the right hand.  She is right-hand dominant.  She has not really taken much for this but just wanted to bring it to my attention.  She also notes that she has been having some locking and popping of that right middle finger.  Nothing significantly painful but again wanted to mention it  2.  Migraine headaches Patient reports good control of her migraine headaches with Zonegran 200 mg daily.  She has not had a migraine headache in several years.  She does have occasional tension headache but nothing that is bad.  3.  Tongue lesion Patient reports every couple of months she gets a spot on the right side of her tongue.  She denies any excessive pain, discoloration or swelling.  She denies any excessive intake of acidic type foods.  No change in diet except for may be some decrease in meat products.  She eats plenty of fruits and vegetables.  She does wear false uppers and is saving up to get false lowers.  The false uppers are greater than 90 years old.  ROS: Per HPI  Allergies  Allergen Reactions  . Codeine   . Hydrocodone   . Robitussin Cough-Cold D [Pseudoephedrine-Dm-Gg]    Past Medical History:  Diagnosis Date  . Anorexia 12/11/2012  . Arthritis   . Coronary artery disease, non-occlusive 07/2016   65% d RCA (FFR 0.88), ~60% small mCx (~2 mm). normal EF.  Marland Kitchen  Gastroparesis   . Insomnia   . Migraine   . Osteopenia   . Postmenopausal   . Tobacco use disorder   . Vitamin D deficiency   . Weight loss     Current Outpatient Medications:  .  albuterol (PROVENTIL HFA;VENTOLIN HFA) 108 (90 Base) MCG/ACT inhaler, Inhale 2 puffs into the lungs every 6 (six) hours as needed for wheezing., Disp: 1 Inhaler, Rfl: 2 .  aspirin EC 81 MG tablet, Take 81 mg by mouth daily., Disp: , Rfl:  .  atorvastatin (LIPITOR) 20 MG tablet, Take 1 tablet (20 mg total) by mouth daily. (Needs to be seen before next refill), Disp: 30 tablet, Rfl: 0 .  bisoprolol (ZEBETA) 5 MG tablet, Take 0.5 tablets (2.5 mg total) by mouth daily. (Needs to be seen before next refill), Disp: 15 tablet, Rfl: 0 .  Cholecalciferol (VITAMIN D) 2000 units CAPS, Take 1 capsule by mouth daily. 1 times a week, Disp: , Rfl:  .  zonisamide (ZONEGRAN) 100 MG capsule, Take 2 capsules (200 mg total) by mouth daily. (Needs to be seen before next refill), Disp: 60 capsule, Rfl: 0 Social History   Socioeconomic History  . Marital status: Legally Separated    Spouse name: Not on file  . Number of children: 6  . Years of education: Not on file  . Highest education level: Not on file  Occupational History  . Occupation: custodian  Employer: [employer]  Tobacco Use  . Smoking status: Current Every Day Smoker    Packs/day: 0.50    Types: Cigarettes  . Smokeless tobacco: Never Used  . Tobacco comment: form given 05-30-13  Substance and Sexual Activity  . Alcohol use: No  . Drug use: No  . Sexual activity: Not on file  Other Topics Concern  . Not on file  Social History Narrative  . Not on file   Social Determinants of Health   Financial Resource Strain:   . Difficulty of Paying Living Expenses: Not on file  Food Insecurity:   . Worried About Charity fundraiser in the Last Year: Not on file  . Ran Out of Food in the Last Year: Not on file  Transportation Needs:   . Lack of  Transportation (Medical): Not on file  . Lack of Transportation (Non-Medical): Not on file  Physical Activity:   . Days of Exercise per Week: Not on file  . Minutes of Exercise per Session: Not on file  Stress:   . Feeling of Stress : Not on file  Social Connections:   . Frequency of Communication with Friends and Family: Not on file  . Frequency of Social Gatherings with Friends and Family: Not on file  . Attends Religious Services: Not on file  . Active Member of Clubs or Organizations: Not on file  . Attends Archivist Meetings: Not on file  . Marital Status: Not on file  Intimate Partner Violence:   . Fear of Current or Ex-Partner: Not on file  . Emotionally Abused: Not on file  . Physically Abused: Not on file  . Sexually Abused: Not on file   Family History  Problem Relation Age of Onset  . Diabetes Mother   . Lung cancer Father   . Heart attack Brother 36  . Other Brother        Triple Bypass  . Heart attack Brother   . Breast cancer Paternal Grandmother   . Skin cancer Maternal Grandmother   . Diabetes Maternal Grandfather   . Heart attack Sister   . Other Sister        blocked arteries  . Hypertension Son   . Hypertension Son   . Colon cancer Neg Hx   . Throat cancer Neg Hx   . Stomach cancer Neg Hx   . Liver disease Neg Hx   . Kidney disease Neg Hx     Objective: Office vital signs reviewed. BP 124/80   Pulse 68   Temp (!) 96.9 F (36.1 C) (Temporal)   Ht 4' 11" (1.499 m)   Wt 108 lb (49 kg)   SpO2 95%   BMI 21.81 kg/m   Physical Examination:  General: Awake, alert, thin female, No acute distress HEENT: sclera white, MMM; no carotid bruits.  Atrophy of papilla noted along the lateral right side of the tongue.  No significant erythema.  No oropharyngeal or sublingual masses noted. Cardio: regular rate and rhythm, S1S2 heard, no murmurs appreciated Pulm: clear to auscultation bilaterally, no wheezes, rhonchi or rales; normal work of  breathing on room air Extremities: warm, well perfused, No edema, cyanosis or clubbing; +2 pulses bilaterally MSK: Bilateral hands with mild osteoarthritic changes noted throughout the joints.  She does have a palpable nodule along the palmar aspect of the right finger at the third digit.  No appreciable locking or popping noted on exam Psych: mood stable, speech normal. Affect appropriate. Depression  screen [facility] 2/9 04/21/2018 11/07/2017 10/21/2017 06/17/2017 02/17/2017  Decreased Interest 0 0 0 0 0  Down, Depressed, Hopeless 0 0 0 0 0  PHQ - 2 Score 0 0 0 0 0  Altered sleeping 2 - - - -  Tired, decreased energy 0 - - - -  Change in appetite 0 - - - -  Feeling bad or failure about yourself  0 - - - -  Trouble concentrating 0 - - - -  Moving slowly or fidgety/restless 0 - - - -  Suicidal thoughts 0 - - - -  PHQ-9 Score 2 - - - -   Assessment/ Plan: 59 y.o. female   1. Coronary artery disease, non-occlusive Continue statin, beta-blocker and daily aspirin - atorvastatin (LIPITOR) 20 MG tablet; Take 1 tablet (20 mg total) by mouth daily.  Dispense: 90 tablet; Refill: 3 - bisoprolol (ZEBETA) 5 MG tablet; Take 0.5 tablets (2.5 mg total) by mouth daily.  Dispense: 90 tablet; Refill: 3  2. Hyperlipidemia with target low density lipoprotein (LDL) cholesterol less than 70 mg/dL Check fasting lipid panel.  Continue statin as above - CMP14+EGFR - Lipid Panel  3. Migraine without aura and without status migrainosus, not intractable Well controlled with Zonegran.  Check CBC - CBC - zonisamide (ZONEGRAN) 100 MG capsule; Take 2 capsules (200 mg total) by mouth daily.  Dispense: 180 capsule; Refill: 3  4. Osteopenia, unspecified location Maintaining balanced diet.  Will defer DEXA scan for now given lack of insurance and financial constraints  5. Screening for deficiency anemia - CBC  6. Primary osteoarthritis of both hands Recommended topical Voltaren gel applied to both hands up to 4 times  daily as needed.  Would avoid oral NSAID given CAD  7. Atrophy of tongue papillae Likely secondary to tobacco use.  However, I have encouraged to start vitamin B12 to see if perhaps this is a vitamin deficiency mediated issue.  Unlikely to be a folic acid issue given incorporation of many fruits and vegetables into her diet.   No orders of the defined types were placed in this encounter.  No orders of the defined types were placed in this encounter.    Janora Norlander, DO [place] [phone]

## 2019-04-18 NOTE — Patient Instructions (Signed)
Voltaren gel will help with the arthritis in your hands. You can use up to 4 times daily if needed.  I think you might have trigger finger in that middle finger.  If needed, Dr Dettinger does trigger finger injections.   Trigger Finger  Trigger finger, also called stenosing tenosynovitis,  is a condition that causes a finger to get stuck in a bent position. Each finger has a tendon, which is a tough, cord-like tissue that connects muscle to bone, and each tendon passes through a tunnel of tissue called a tendon sheath. To move your finger, your tendon needs to glide freely through the sheath. Trigger finger happens when the tendon or the sheath thickens, making it difficult to move your finger. Trigger finger can affect any finger or a thumb. It may affect more than one finger. Mild cases may clear up with rest and medicine. Severe cases require more treatment. What are the causes? Trigger finger is caused by a thickened finger tendon or tendon sheath. The cause of this thickening is not known. What increases the risk? The following factors may make you more likely to develop this condition:  Doing activities that require a strong grip.  Having rheumatoid arthritis, gout, or diabetes.  Being 70-44 years old.  Being female. What are the signs or symptoms? Symptoms of this condition include:  Pain when bending or straightening your finger.  Tenderness or swelling where your finger attaches to the palm of your hand.  A lump in the palm of your hand or on the inside of your finger.  Hearing a noise like a pop or a snap when you try to straighten your finger.  Feeling a catching or locking sensation when you try to straighten your finger.  Being unable to straighten your finger. How is this diagnosed? This condition is diagnosed based on your symptoms and a physical exam. How is this treated? This condition may be treated by:  Resting your finger and avoiding activities that make  symptoms worse.  Wearing a finger splint to keep your finger extended.  Taking NSAIDs, such as ibuprofen, to relieve pain and swelling.  Doing gentle exercises to stretch the finger as told by your health care provider.  Having medicine that reduces swelling and inflammation (steroids) injected into the tendon sheath. Injections may need to be repeated.  Having surgery to open the tendon sheath. This may be done if other treatments do not work and you cannot straighten your finger. You may need physical therapy after surgery. Follow these instructions at home: If you have a splint:  Wear the splint as told by your health care provider. Remove it only as told by your health care provider.  Loosen it if your fingers tingle, become numb, or turn cold and blue.  Keep it clean.  If the splint is not waterproof: ? Do not let it get wet. ? Cover it with a watertight covering when you take a bath or shower. Managing pain, stiffness, and swelling     If directed, apply heat to the affected area as often as told by your health care provider. Use the heat source that your health care provider recommends, such as a moist heat pack or a heating pad.  Place a towel between your skin and the heat source.  Leave the heat on for 20-30 minutes.  Remove the heat if your skin turns bright red. This is especially important if you are unable to feel pain, heat, or cold. You may have  a greater risk of getting burned. If directed, put ice on the painful area. To do this:  If you have a removable splint, remove it as told by your health care provider.  Put ice in a plastic bag.  Place a towel between your skin and the bag or between your splint and the bag.  Leave the ice on for 20 minutes, 2-3 times a day.  Activity  Rest your finger as told by your health care provider. Avoid activities that make the pain worse.  Return to your normal activities as told by your health care provider. Ask your  health care provider what activities are safe for you.  Do exercises as told by your health care provider.  Ask your health care provider when it is safe to drive if you have a splint on your hand. General instructions  Take over-the-counter and prescription medicines only as told by your health care provider.  Keep all follow-up visits as told by your health care provider. This is important. Contact a health care provider if:  Your symptoms are not improving with home care. Summary  Trigger finger, also called stenosing tenosynovitis, causes your finger to get stuck in a bent position. This can make it difficult and painful to straighten your finger.  This condition develops when a finger tendon or tendon sheath thickens.  Treatment may include resting your finger, wearing a splint, and taking medicines.  In severe cases, surgery to open the tendon sheath may be needed. This information is not intended to replace advice given to you by your health care provider. Make sure you discuss any questions you have with your health care provider. Document Revised: 06/12/2018 Document Reviewed: 06/12/2018 Elsevier Patient Education  [place].

## 2019-04-19 LAB — CBC
Hematocrit: 39.9 % (ref 34.0–46.6)
Hemoglobin: 13.5 g/dL (ref 11.1–15.9)
MCH: 31.7 pg (ref 26.6–33.0)
MCHC: 33.8 g/dL (ref 31.5–35.7)
MCV: 94 fL (ref 79–97)
Platelets: 172 10*3/uL (ref 150–450)
RBC: 4.26 x10E6/uL (ref 3.77–5.28)
RDW: 12.5 % (ref 11.7–15.4)
WBC: 5.1 10*3/uL (ref 3.4–10.8)

## 2019-04-19 LAB — CMP14+EGFR
ALT: 15 IU/L (ref 0–32)
AST: 24 IU/L (ref 0–40)
Albumin/Globulin Ratio: 2.4 — ABNORMAL HIGH (ref 1.2–2.2)
Albumin: 4.5 g/dL (ref 3.8–4.8)
Alkaline Phosphatase: 56 IU/L (ref 39–117)
BUN/Creatinine Ratio: 18 (ref 12–28)
BUN: 13 mg/dL (ref 8–27)
Bilirubin Total: 0.3 mg/dL (ref 0.0–1.2)
CO2: 22 mmol/L (ref 20–29)
Calcium: 9 mg/dL (ref 8.7–10.3)
Chloride: 108 mmol/L — ABNORMAL HIGH (ref 96–106)
Creatinine, Ser: 0.73 mg/dL (ref 0.57–1.00)
GFR calc Af Amer: 103 mL/min/{1.73_m2} (ref >59)
GFR calc non Af Amer: 89 mL/min/{1.73_m2} (ref >59)
Globulin, Total: 1.9 g/dL (ref 1.5–4.5)
Glucose: 87 mg/dL (ref 65–99)
Potassium: 4.2 mmol/L (ref 3.5–5.2)
Sodium: 143 mmol/L (ref 134–144)
Total Protein: 6.4 g/dL (ref 6.0–8.5)

## 2019-04-19 LAB — LIPID PANEL
Chol/HDL Ratio: 1.9 ratio (ref 0.0–4.4)
Cholesterol, Total: 121 mg/dL (ref 100–199)
HDL: 63 mg/dL (ref >39)
LDL Chol Calc (NIH): 46 mg/dL (ref 0–99)
Triglycerides: 55 mg/dL (ref 0–149)
VLDL Cholesterol Cal: 12 mg/dL (ref 5–40)

## 2020-02-18 ENCOUNTER — Encounter: Payer: Self-pay | Admitting: Cardiology

## 2020-02-18 ENCOUNTER — Other Ambulatory Visit: Payer: Self-pay

## 2020-02-18 ENCOUNTER — Ambulatory Visit (INDEPENDENT_AMBULATORY_CARE_PROVIDER_SITE_OTHER): Payer: 59 | Admitting: Cardiology

## 2020-02-18 VITALS — BP 144/72 | HR 68 | Ht 59.0 in | Wt 112.0 lb

## 2020-02-18 DIAGNOSIS — I2510 Atherosclerotic heart disease of native coronary artery without angina pectoris: Secondary | ICD-10-CM | POA: Diagnosis not present

## 2020-02-18 DIAGNOSIS — I10 Essential (primary) hypertension: Secondary | ICD-10-CM | POA: Diagnosis not present

## 2020-02-18 DIAGNOSIS — F17200 Nicotine dependence, unspecified, uncomplicated: Secondary | ICD-10-CM

## 2020-02-18 DIAGNOSIS — F17210 Nicotine dependence, cigarettes, uncomplicated: Secondary | ICD-10-CM | POA: Diagnosis not present

## 2020-02-18 DIAGNOSIS — Z7189 Other specified counseling: Secondary | ICD-10-CM | POA: Insufficient documentation

## 2020-02-18 DIAGNOSIS — E785 Hyperlipidemia, unspecified: Secondary | ICD-10-CM

## 2020-02-18 NOTE — Progress Notes (Signed)
Primary Care Provider: Raliegh IpGottschalk, Ashly M, DO Cardiologist: Bryan Lemmaavid Zykeriah Mathia, MD Electrophysiologist: None  Clinic Note: Chief Complaint  Patient presents with  . Coronary Artery Disease    Nonocclusive.  No real active exertional chest pain or shortness of breath  . Follow-up    Delayed    Problem List Items Addressed This Visit    Coronary artery disease, non-occlusive (Chronic)   Tobacco use disorder (Chronic)   Hyperlipidemia with target low density lipoprotein (LDL) cholesterol less than 70 mg/dL - Primary (Chronic)   Essential hypertension   Educated about COVID-19 virus infection      HPI:    Susan Fischer is a 60 y.o. female (long-term heavy smoker) with a PMH notable for moderate-nonobstructive CAD (on June 2018), with significant family history of early CAD (brother had MI in his 430s) who presents today for > 1.25 year follow-up.Marland Kitchen.  Susan Fischer was last seen by me on November 03, 2016 and 131-month follow-up from her cardiac catheterization.-No further chest pain episodes.  Occasional fluttering in her chest when coughing.  Fluttering improved with bisoprolol.  No exertional chest pain.  Doing lighter duty work.  Occasional lightheadedness dizziness. => Unable to titrate beta-blocker further.  Exertional dyspnea likely related to COPD.  Started on atorvastatin 20 mg daily.  Her most recent follow-up was in October 2020 via telemedicine with Azalee CourseHao Meng, PA -> she is doing very well.  No real significant symptoms.  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  He cut down to 1/2 pack of cigarettes a day.  Was regaining some of her weight back.  Maintaining roughly 100 pounds.  Noted nocturnal hypotension and insomnia.  Recent Hospitalizations:   None  Reviewed  CV studies:    The following studies were reviewed today: (if available, images/films reviewed: From Epic Chart or Care Everywhere) . None:   Interval History:   Susan Fischer returns  urgent today for delayed follow-up.  She really has no major complaints.  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  She says that she is able to do all his activities without any significant chest discomfort or dyspnea.  She may get little short of breath if she has to rush up a few flights of stairs or carrying groceries up steps.  She has off-and-on short-lived chest tightness that can happen with or without exertion, happens off and on at random.  She says that she has random off-and-on palpitations--3-4 times a day lasting about 5 to 6 minutes.  Again no rhyme or reason for when these occur.  She says that there is nothing that really bothers her.  She has no lightheadedness or dizziness, just feels a fluttering.  Similar to what was noted before.  She notes that her legs get cold at night, but not necessarily with walking.  She is still smoking about a pack a day.  She has thought about quitting, but really hasn't taken seriously.  She is trying to minimize how much she smokes but is not ready to quit. Doing better with her diet.  Maintaining weight.  CV Review of Symptoms (Summary): positive for - irregular heartbeat, palpitations and Exertional dyspnea if she overdoes it negative for - chest pain, edema, orthopnea, paroxysmal nocturnal dyspnea, rapid heart rate, shortness of breath or Lightheadedness or dizziness, syncope/near syncope or TIA/amaurosis fugax, claudication.  The patient does not have symptoms concerning  for COVID-19 infection (fever, chills, cough, or new shortness of breath).   REVIEWED OF SYSTEMS   Review of Systems  Constitutional: Negative for malaise/fatigue and weight loss.  HENT: Negative for congestion and nosebleeds.   Respiratory: Positive for cough (Nonproductive-morning) and wheezing (Off and on).   Cardiovascular:       Per HPI  Gastrointestinal: Negative for abdominal pain, blood in stool, constipation  and melena.  Genitourinary: Negative for hematuria.  Musculoskeletal: Positive for back pain and joint pain (Mild aches and pains). Negative for falls and myalgias.  Neurological: Negative for dizziness, focal weakness, weakness and headaches.  Psychiatric/Behavioral: Negative for depression and memory loss. The patient has insomnia. The patient is not nervous/anxious (She seems hyper).    I have reviewed and (if needed) personally updated the patient's problem list, medications, allergies, past medical and surgical history, social and family history.   PAST MEDICAL HISTORY   Past Medical History:  Diagnosis Date  . Anorexia 12/11/2012  . Arthritis   . Coronary artery disease, non-occlusive 07/2016   65% d RCA (FFR 0.88), ~60% small mCx (~2 mm). normal EF.  Marland Kitchen Gastroparesis   . Insomnia   . Migraine   . Osteopenia   . Postmenopausal   . Tobacco use disorder   . Vitamin D deficiency   . Weight loss     PAST SURGICAL HISTORY   Past Surgical History:  Procedure Laterality Date  . ABDOMINAL HYSTERECTOMY    . COLONOSCOPY  2011   [place]  . INTRAVASCULAR PRESSURE WIRE/FFR STUDY N/A 07/21/2016   Procedure: Intravascular Pressure Wire/FFR Study;  Surgeon: Marykay Lex, MD;  [facility] IN[facility]ASIVE CV LAB;  Service: Cardiovascular;  Laterality: RCA - FFR 0.88 - Not Significant  . LEFT HEART CATH AND CORONARY ANGIOGRAPHY N/A 07/21/2016   Procedure: Left Heart Cath and Coronary Angiography;  Surgeon: Marykay Lex, MD;  Location: [facility] INVASIVE CV LAB:  pRCA ~30%, dRCA 65% (FFR 0.88), mCx (small ~66mm) ~60%.  EF 55-65%. Normal LVEDP   Cardiac Cath July 21, 2016:  pRCA ~30%, dRCA 65% (FFR 0.88), mCx (small ~59mm) ~60%.    Immunization History  Administered Date(s) Administered  . Influenza,inj,Quad PF,6+ Mos 11/24/2012  . Tdap 12/10/2010  . Zoster Recombinat (Shingrix) 10/21/2017    MEDICATIONS/ALLERGIES   Current Meds  Medication Sig  . albuterol (PROVENTIL HFA;VENTOLIN HFA) 108  (90 Base) MCG/ACT inhaler Inhale 2 puffs into the lungs every 6 (six) hours as needed for wheezing.  Marland Kitchen aspirin EC 81 MG tablet Take 81 mg by mouth daily.  Marland Kitchen atorvastatin (LIPITOR) 20 MG tablet Take 1 tablet (20 mg total) by mouth daily.  . bisoprolol (ZEBETA) 5 MG tablet Take 0.5 tablets (2.5 mg total) by mouth daily.  . Cholecalciferol (VITAMIN D) 2000 units CAPS Take 1 capsule by mouth daily. 1 times a week  . zonisamide (ZONEGRAN) 100 MG capsule Take 2 capsules (200 mg total) by mouth daily.    Allergies  Allergen Reactions  . Codeine   . Hydrocodone   . Robitussin Cough-Cold D [Pseudoephedrine-Dm-Gg]     SOCIAL HISTORY/FAMILY HISTORY   Reviewed in Epic:  Pertinent findings:  Social History   Tobacco Use  . Smoking status: Current Every Day Smoker    Packs/day: 0.50    Types: Cigarettes  . Smokeless tobacco: Never Used  . Tobacco comment: form given 05-30-13  Vaping Use  . Vaping Use: Never used  Substance Use Topics  . Alcohol use: No  . Drug use:  No   Social History   Social History Narrative  . Not on file    OBJCTIVE -PE, EKG, labs   Wt Readings from Last 3 Encounters:  02/18/20 112 lb (50.8 kg)  04/18/19 108 lb (49 kg)  11/28/18 100 lb (45.4 kg)    Physical Exam: BP (!) 144/72   Pulse 68   Ht 4\' 11"  (1.499 m)   Wt 112 lb (50.8 kg)   BMI 22.62 kg/m  Physical Exam Vitals reviewed.  Constitutional:      General: She is not in acute distress.    Appearance: Normal appearance. She is not ill-appearing (Thin and frail, but not ill-appearing) or toxic-appearing.  HENT:     Head: Normocephalic and atraumatic.  Neck:     Vascular: No carotid bruit or JVD.  Cardiovascular:     Rate and Rhythm: Normal rate and regular rhythm.  No extrasystoles are present.    Chest Wall: PMI is not displaced.     Pulses: Decreased pulses (Mildly diminished but palpable pedal pulses.  Warm).     Heart sounds: Normal heart sounds. No murmur heard. No friction rub. No  gallop.   Pulmonary:     Effort: Pulmonary effort is normal. No respiratory distress.     Breath sounds: Wheezing (Faint end-expiratory) present.     Comments: Mild diminished breath sounds throughout with interstitial sounds, but no rhonchi or rales.  Faint wheezing. Chest:     Chest wall: No tenderness.  Musculoskeletal:        General: Normal range of motion.     Cervical back: Normal range of motion and neck supple.     Right lower leg: No edema.  Skin:    General: Skin is warm and dry.  Neurological:     General: No focal deficit present.     Mental Status: She is alert and oriented to person, place, and time. Mental status is at baseline.  Psychiatric:        Mood and Affect: Mood normal.        Behavior: Behavior normal.        Thought Content: Thought content normal.        Judgment: Judgment normal.     Adult ECG Report  Rate: 68 ;  Rhythm: normal sinus rhythm and Nonspecific ST and T wave changes.  Otherwise normal axis, intervals and durations.;   Narrative Interpretation: Stable EKG  Recent Labs:  Due in March 2022 Lab Results  Component Value Date   CHOL 121 04/18/2019   HDL 63 04/18/2019   LDLCALC 46 04/18/2019   TRIG 55 04/18/2019   CHOLHDL 1.9 04/18/2019   Lab Results  Component Value Date   CREATININE 0.73 04/18/2019   BUN 13 04/18/2019   NA 143 04/18/2019   K 4.2 04/18/2019   CL 108 (H) 04/18/2019   CO2 22 04/18/2019    CBC Latest Ref Rng & Units 04/18/2019 06/17/2017 07/16/2016  WBC 3.4 - 10.8 x10E3/uL 5.1 4.9 5.6  Hemoglobin 11.1 - 15.9 g/dL 09/15/2016 99.3 57.0  Hematocrit 34.0 - 46.6 % 39.9 39.2 42.8  Platelets 150 - 450 x10E3/uL 172 182 206    Lab Results  Component Value Date   TSH 3.330 06/25/2016    ASSESSMENT/PLAN    Problem List Items Addressed This Visit    Coronary artery disease, non-occlusive - Primary (Chronic)    Cardiac cath with moderate disease in the RCA, not physiologically significant by FFR.  Likely has mild microvascular  disease as well.  No active angina symptoms.  I suspect that her chest comfort is still musculoskeletal in nature.  This seems to be worse with certain movements, and not associate with exertion.   Plan: Continue aspirin and statin along with low-dose beta-blocker.  She seems to be well controlled at this point.      Relevant Orders   EKG 12-Lead (Completed)   Tobacco use disorder (Chronic)    Smoking cessation instruction/counseling given:  counseled patient on the dangers of tobacco use, advised patient to stop smoking, and reviewed strategies to maximize success;  We talked about ways of quitting, she doesn't seem to be all that interested at this point.  4 minutes spent.      Hyperlipidemia with target low density lipoprotein (LDL) cholesterol less than 70 mg/dL (Chronic)    LDL 46 in March.  PCP following up soon.  Doing well on current dose of statin.  No myalgias.  No change.      Essential hypertension (Chronic)    Blood pressure is borderline today on bisoprolol.  Has had history of hypotension in the past, therefore we are allowing for mild permissive hypertension.  Continue to monitor however, if she remains in the class I hypertension range, may need to consider adding ARB at low-dose.      Relevant Orders   EKG 12-Lead (Completed)   Educated about COVID-19 virus infection (Chronic)    She was not sure about getting the vaccine follow-up boosters.  She did get her first 2 vaccines, but was not sure about the booster.  I talked her about importance of maintaining immunity to COVID especially with her risk factors.  She will take it on an event and has considered going to get her booster.         COVID-19 Education: The signs and symptoms of COVID-19 were discussed with the patient and how to seek care for testing (follow up with PCP or arrange E-visit).   The importance of social distancing and COVID-19 vaccination was discussed today. -No getting Vaccine (stays by  herself ) -->5 min The patient is practicing social distancing & Masking.   I spent a total of with the patient spent in direct patient consultation.  Additional time spent with chart review  / charting (studies, outside notes, etc): 15 min Total Time: 32 min   Current medicines are reviewed at length with the patient today.  (+/- concerns) n/a  This visit occurred during the SARS-CoV-2 public health emergency.  Safety protocols were in place, including screening questions prior to the visit, additional usage of staff PPE, and extensive cleaning of exam room while observing appropriate contact time as indicated for disinfecting solutions.  Notice: This dictation was prepared with Dragon dictation along with smaller phrase technology. Any transcriptional errors that result from this process are unintentional and may not be corrected upon review.  Patient Instructions / Medication Changes & Studies & Tests Ordered   Patient Instructions  Medication Instructions:  No changes  *If you need a refill on your cardiac medications before your next appointment, please call your pharmacy*   Lab Work:  Not needed   Testing/Procedures: Not needed   Follow-Up: At [facility], you and your health needs are our priority.  As part of our continuing mission to provide you with exceptional heart care, we have created designated Provider Care Teams.  These Care Teams include your primary Cardiologist (physician) and Advanced Practice Providers (APPs -  Physician Assistants  and Nurse Practitioners) who all work together to provide you with the care you need, when you need it.     Your next appointment:   12 month(s)  The format for your next appointment:   In Person  Provider:   Bryan Lemmaavid Kristofer Schaffert, MD   Other Instructions Your physician discussed the hazards of tobacco use. Tobacco use cessation is recommended and techniques and options to help you quit were discussed.    Recommend you take covid vaccine    Monitor leg aching     Studies Ordered:   Orders Placed This Encounter  Procedures  . EKG 12-Lead     Bryan Lemmaavid Chales Pelissier, M.D., M.S. Interventional Cardiologist   Pager # [phone] Phone # [phone] [address] [place], [place] [address]   Thank you for choosing [facility] at [facility]!!

## 2020-02-18 NOTE — Patient Instructions (Signed)
Medication Instructions:  No changes  *If you need a refill on your cardiac medications before your next appointment, please call your pharmacy*   Lab Work:  Not needed   Testing/Procedures: Not needed   Follow-Up: At [facility], you and your health needs are our priority.  As part of our continuing mission to provide you with exceptional heart care, we have created designated Provider Care Teams.  These Care Teams include your primary Cardiologist (physician) and Advanced Practice Providers (APPs -  Physician Assistants and Nurse Practitioners) who all work together to provide you with the care you need, when you need it.     Your next appointment:   12 month(s)  The format for your next appointment:   In Person  Provider:   Bryan Lemma, MD   Other Instructions Your physician discussed the hazards of tobacco use. Tobacco use cessation is recommended and techniques and options to help you quit were discussed.   Recommend you take covid vaccine    Monitor leg aching

## 2020-02-25 ENCOUNTER — Encounter: Payer: Self-pay | Admitting: Cardiology

## 2020-02-25 NOTE — Assessment & Plan Note (Addendum)
Smoking cessation instruction/counseling given:  counseled patient on the dangers of tobacco use, advised patient to stop smoking, and reviewed strategies to maximize success;  We talked about ways of quitting, she doesn't seem to be all that interested at this point.  4 minutes spent.

## 2020-02-25 NOTE — Assessment & Plan Note (Signed)
Cardiac cath with moderate disease in the RCA, not physiologically significant by FFR.  Likely has mild microvascular disease as well.  No active angina symptoms.  I suspect that her chest comfort is still musculoskeletal in nature.  This seems to be worse with certain movements, and not associate with exertion.   Plan: Continue aspirin and statin along with low-dose beta-blocker.  She seems to be well controlled at this point.

## 2020-02-25 NOTE — Assessment & Plan Note (Signed)
Blood pressure is borderline today on bisoprolol.  Has had history of hypotension in the past, therefore we are allowing for mild permissive hypertension.  Continue to monitor however, if she remains in the class I hypertension range, may need to consider adding ARB at low-dose.

## 2020-02-25 NOTE — Assessment & Plan Note (Signed)
She was not sure about getting the vaccine follow-up boosters.  She did get her first 2 vaccines, but was not sure about the booster.  I talked her about importance of maintaining immunity to COVID especially with her risk factors.  She will take it on an event and has considered going to get her booster.

## 2020-02-25 NOTE — Assessment & Plan Note (Signed)
LDL 46 in March.  PCP following up soon.  Doing well on current dose of statin.  No myalgias.  No change.

## 2020-07-01 ENCOUNTER — Other Ambulatory Visit: Payer: Self-pay | Admitting: Family Medicine

## 2020-07-01 DIAGNOSIS — I2510 Atherosclerotic heart disease of native coronary artery without angina pectoris: Secondary | ICD-10-CM

## 2020-07-01 DIAGNOSIS — G43009 Migraine without aura, not intractable, without status migrainosus: Secondary | ICD-10-CM

## 2020-07-04 ENCOUNTER — Telehealth: Payer: Self-pay | Admitting: Family Medicine

## 2020-07-04 NOTE — Telephone Encounter (Signed)
Pt made an appt today for med refills with Dr Reece Agar for 8/1 (first open) but she will be out of medication before this appt.

## 2020-07-04 NOTE — Telephone Encounter (Signed)
Needs appointment with Dr, Reece Agar when nurse or dr. Reece Agar can work her in

## 2020-07-08 NOTE — Telephone Encounter (Signed)
Dr. Reece Agar nurse spoke with patient and made an appointment

## 2020-07-15 ENCOUNTER — Encounter: Payer: Self-pay | Admitting: Family Medicine

## 2020-07-15 ENCOUNTER — Ambulatory Visit (INDEPENDENT_AMBULATORY_CARE_PROVIDER_SITE_OTHER): Payer: 59 | Admitting: Family Medicine

## 2020-07-15 VITALS — BP 125/70 | Wt 110.0 lb

## 2020-07-15 DIAGNOSIS — M85851 Other specified disorders of bone density and structure, right thigh: Secondary | ICD-10-CM

## 2020-07-15 DIAGNOSIS — G43009 Migraine without aura, not intractable, without status migrainosus: Secondary | ICD-10-CM | POA: Diagnosis not present

## 2020-07-15 DIAGNOSIS — E785 Hyperlipidemia, unspecified: Secondary | ICD-10-CM

## 2020-07-15 DIAGNOSIS — I2510 Atherosclerotic heart disease of native coronary artery without angina pectoris: Secondary | ICD-10-CM

## 2020-07-15 DIAGNOSIS — F17200 Nicotine dependence, unspecified, uncomplicated: Secondary | ICD-10-CM

## 2020-07-15 MED ORDER — ATORVASTATIN CALCIUM 20 MG PO TABS: 1.0000 | ORAL_TABLET | Freq: Every day | ORAL | 3 refills | Status: DC

## 2020-07-15 MED ORDER — BISOPROLOL FUMARATE 5 MG PO TABS: 2.5000 mg | ORAL_TABLET | Freq: Every day | ORAL | 3 refills | Status: DC

## 2020-07-15 MED ORDER — ZONISAMIDE 100 MG PO CAPS: 200.0000 mg | ORAL_CAPSULE | Freq: Every day | ORAL | 3 refills | Status: DC

## 2020-07-15 NOTE — Progress Notes (Signed)
Telephone visit  Subjective: CC:f/u HTN, HLD PCP: Janora Norlander, DO SFK:CLEXN B Garlitz is a 60 y.o. female calls for telephone consult today. Patient provides verbal consent for consult held via phone.  Due to COVID-19 pandemic this visit was conducted virtually. This visit type was conducted due to national recommendations for restrictions regarding the COVID-19 Pandemic (e.g. social distancing, sheltering in place) in an effort to limit this patient's exposure and mitigate transmission in our community. All issues noted in this document were discussed and addressed.  A physical exam was not performed with this format.   Location of patient: home Location of provider: [facility] Others present for call: none  1. HTN, HLD, CAD Patient reports compliance with 2.5 mg of bisoprolol, 20 mg of Lipitor and daily aspirin 81 mg.  No chest pain, shortness of breath, dizziness or swelling.  She has occasional headaches see below.  She continues to smoke about half pack per day.  Denies any difficulty swallowing, night sweats.  She is in fact gained some weight since her last visit.  No hemoptysis.  2. Migraine headache Migraine headaches under fairly good control with daily Zonegran 200 mg.  She occasionally has to take a Motrin but this is rare.  Needs refills   ROS: Per HPI  Allergies  Allergen Reactions  . Codeine   . Hydrocodone   . Robitussin Cough-Cold D [Pseudoephedrine-Dm-Gg]    Past Medical History:  Diagnosis Date  . Anorexia 12/11/2012  . Arthritis   . Coronary artery disease, non-occlusive 07/2016   65% d RCA (FFR 0.88), ~60% small mCx (~2 mm). normal EF.  Marland Kitchen Gastroparesis   . Insomnia   . Migraine   . Osteopenia   . Postmenopausal   . Tobacco use disorder   . Vitamin D deficiency   . Weight loss     Current Outpatient Medications:  .  albuterol (PROVENTIL HFA;VENTOLIN HFA) 108 (90 Base) MCG/ACT inhaler, Inhale 2 puffs into the lungs every 6 (six) hours as needed for  wheezing., Disp: 1 Inhaler, Rfl: 2 .  aspirin EC 81 MG tablet, Take 81 mg by mouth daily., Disp: , Rfl:  .  atorvastatin (LIPITOR) 20 MG tablet, Take 1 tablet (20 mg total) by mouth daily. (NEEDS TO BE SEEN BEFORE NEXT REFILL), Disp: 30 tablet, Rfl: 0 .  bisoprolol (ZEBETA) 5 MG tablet, Take 0.5 tablets (2.5 mg total) by mouth daily., Disp: 90 tablet, Rfl: 3 .  Cholecalciferol (VITAMIN D) 2000 units CAPS, Take 1 capsule by mouth daily. 1 times a week, Disp: , Rfl:  .  zonisamide (ZONEGRAN) 100 MG capsule, Take 2 capsules (200 mg total) by mouth daily. (NEEDS TO BE SEEN BEFORE NEXT REFILL), Disp: 60 capsule, Rfl: 0  Assessment/ Plan: 60 y.o. female   Coronary artery disease, non-occlusive - Plan: atorvastatin (LIPITOR) 20 MG tablet, bisoprolol (ZEBETA) 5 MG tablet, CMP14+EGFR, Lipid panel  Hyperlipidemia with target low density lipoprotein (LDL) cholesterol less than 70 mg/dL  Tobacco use disorder - Plan: CBC  Migraine without aura and without status migrainosus, not intractable - Plan: zonisamide (ZONEGRAN) 100 MG capsule, CBC  Osteopenia of neck of right femur - Plan: DG [facility] DEXA, CBC, TSH  I reviewed her last office visit with her cardiologist.  Her office BP was not at goal but her reported BPs at home are within goal.  No changes to medications made.  She will continue Lipitor, Zebeta.  Fasting lipid panel, CMP, TSH ordered.  CBC given tobacco use ordered and  DEXA scan ordered given history of osteopenia of her femur.  Insurance did not cover vitamin D testing previously so this was not collected.  All medications have been renewed.  She may follow-up with me at her earliest convenience.  Tobacco cessation counseling performed.  Patient is contemplative.  Start time: 2:00pm End time: 2:07pm  Total time spent on patient care (including telephone call/ virtual visit): 7 minutes  [place], [place] [phone]

## 2020-08-12 ENCOUNTER — Other Ambulatory Visit: Payer: 59

## 2020-09-08 ENCOUNTER — Ambulatory Visit (INDEPENDENT_AMBULATORY_CARE_PROVIDER_SITE_OTHER): Payer: 59 | Admitting: Family Medicine

## 2020-09-08 ENCOUNTER — Other Ambulatory Visit: Payer: Self-pay

## 2020-09-08 ENCOUNTER — Encounter: Payer: Self-pay | Admitting: Family Medicine

## 2020-09-08 ENCOUNTER — Ambulatory Visit: Payer: 59

## 2020-09-08 VITALS — BP 127/80 | HR 69 | Temp 97.9°F | Ht 59.0 in | Wt 112.0 lb

## 2020-09-08 DIAGNOSIS — H6591 Unspecified nonsuppurative otitis media, right ear: Secondary | ICD-10-CM

## 2020-09-08 DIAGNOSIS — Z23 Encounter for immunization: Secondary | ICD-10-CM | POA: Diagnosis not present

## 2020-09-08 DIAGNOSIS — G43009 Migraine without aura, not intractable, without status migrainosus: Secondary | ICD-10-CM

## 2020-09-08 DIAGNOSIS — I2510 Atherosclerotic heart disease of native coronary artery without angina pectoris: Secondary | ICD-10-CM

## 2020-09-08 MED ORDER — ALBUTEROL SULFATE HFA 108 (90 BASE) MCG/ACT IN AERS: 2.0000 | INHALATION_SPRAY | Freq: Four times a day (QID) | RESPIRATORY_TRACT | 2 refills | Status: DC | PRN

## 2020-09-08 MED ORDER — ATORVASTATIN CALCIUM 20 MG PO TABS: 20.0000 mg | ORAL_TABLET | Freq: Every day | ORAL | 3 refills | Status: DC

## 2020-09-08 MED ORDER — ZONISAMIDE 100 MG PO CAPS: 200.0000 mg | ORAL_CAPSULE | Freq: Every day | ORAL | 3 refills | Status: DC

## 2020-09-08 MED ORDER — BISOPROLOL FUMARATE 5 MG PO TABS: 2.5000 mg | ORAL_TABLET | Freq: Every day | ORAL | 3 refills | Status: DC

## 2020-09-08 NOTE — Patient Instructions (Signed)
You look like there is a little bit of fluid behind the right ear drum.  Not infected but likely explains these fluttering episodes in your ear with dizziness. Start Claritin (loratidine) 10mg  daily.  If no improvement in a few weeks, let me know.  Eustachian Tube Dysfunction  Eustachian tube dysfunction refers to a condition in which a blockage develops in the narrow passage that connects the middle ear to the back of the nose (eustachian tube). The eustachian tube regulates air pressure in the middle ear by letting air move between the ear and nose. It also helps to drain fluid from the middle earspace. Eustachian tube dysfunction can affect one or both ears. When the eustachian tube does not function properly, air pressure, fluid, or both can build up inthe middle ear. What are the causes? This condition occurs when the eustachian tube becomes blocked or cannot open normally. Common causes of this condition include: Ear infections. Colds and other infections that affect the nose, mouth, and throat (upper respiratory tract). Allergies. Irritation from cigarette smoke. Irritation from stomach acid coming up into the esophagus (gastroesophageal reflux). The esophagus is the tube that carries food from the mouth to the stomach. Sudden changes in air pressure, such as from descending in an airplane or scuba diving. Abnormal growths in the nose or throat, such as: Growths that line the nose (nasal polyps). Abnormal growth of cells (tumors). Enlarged tissue at the back of the throat (adenoids). What increases the risk? You are more likely to develop this condition if: You smoke. You are overweight. You are a child who has: Certain birth defects of the mouth, such as cleft palate. Large tonsils or adenoids. What are the signs or symptoms? Common symptoms of this condition include: A feeling of fullness in the ear. Ear pain. Clicking or popping noises in the ear. Ringing in the ear. Hearing  loss. Loss of balance. Dizziness. Symptoms may get worse when the air pressure around you changes, such as whenyou travel to an area of high elevation, fly on an airplane, or go scuba diving. How is this diagnosed? This condition may be diagnosed based on: Your symptoms. A physical exam of your ears, nose, and throat. Tests, such as those that measure: The movement of your eardrum (tympanogram). Your hearing (audiometry). How is this treated? Treatment depends on the cause and severity of your condition. In mild cases, you may relieve your symptoms by moving air into your ears. This is called "popping the ears." In more severe cases, or if you have symptoms of fluid in your ears, treatment may include: Medicines to relieve congestion (decongestants). Medicines that treat allergies (antihistamines). Nasal sprays or ear drops that contain medicines that reduce swelling (steroids). A procedure to drain the fluid in your eardrum (myringotomy). In this procedure, a small tube is placed in the eardrum to: Drain the fluid. Restore the air in the middle ear space. A procedure to insert a balloon device through the nose to inflate the opening of the eustachian tube (balloon dilation). Follow these instructions at home: Lifestyle Do not do any of the following until your health care provider approves: Travel to high altitudes. Fly in airplanes. Work in a or room. Scuba dive. Do not use any products that contain nicotine or tobacco, such as cigarettes and e-cigarettes. If you need help quitting, ask your health care provider. Keep your ears dry. Wear fitted earplugs during showering and bathing. Dry your ears completely after. General instructions Take over-the-counter and  prescription medicines only as told by your health care provider. Use techniques to help pop your ears as recommended by your health care provider. These may include: Chewing gum. Yawning. Frequent,  forceful swallowing. Closing your mouth, holding your nose closed, and gently blowing as if you are trying to blow air out of your nose. Keep all follow-up visits as told by your health care provider. This is important. Contact a health care provider if: Your symptoms do not go away after treatment. Your symptoms come back after treatment. You are unable to pop your ears. You have: A fever. Pain in your ear. Pain in your head or neck. Fluid draining from your ear. Your hearing suddenly changes. You become very dizzy. You lose your balance. Summary Eustachian tube dysfunction refers to a condition in which a blockage develops in the eustachian tube. It can be caused by ear infections, allergies, inhaled irritants, or abnormal growths in the nose or throat. Symptoms include ear pain, hearing loss, or ringing in the ears. Mild cases are treated with maneuvers to unblock the ears, such as yawning or ear popping. Severe cases are treated with medicines. Surgery may also be done (rare). This information is not intended to replace advice given to you by your health care provider. Make sure you discuss any questions you have with your healthcare provider. Document Revised: 05/17/2017 Document Reviewed: 05/17/2017 Elsevier Patient Education  2022 [employer].

## 2020-09-08 NOTE — Progress Notes (Signed)
Subjective: CC: Follow-up CAD, hyperlipidemia, hypertension PCP: Raliegh Ip, DO EHM:CNOBS B Susan is a 60 y.o. female presenting to clinic today for:  1.  Hypertension with hyperlipidemia and CAD; dizziness Patient is compliant with Lipitor, bisoprolol and aspirin daily.   She has had a few episodes of dizziness where she got lightheaded after having a fluttering sensation in her right ear.  The first instance occurred when she was dusting someone's house and fell over.  She admits that she squats when she dusts and always gets up extremely slowly.  She feels that she hydrates well.  Denies any chest pain, shortness of breath or sensation of heart skipping beats.  Not on any allergy medications.  2.  Migraine headaches Continues to take sonogram for migraine headaches.  No reports of breakthrough migraines.  ROS: Per HPI  Allergies  Allergen Reactions   Codeine    Hydrocodone    Robitussin Cough-Cold D [Pseudoephedrine-Dm-Gg]    Past Medical History:  Diagnosis Date   Anorexia 12/11/2012   Arthritis    Coronary artery disease, non-occlusive 07/2016   65% d RCA (FFR 0.88), ~60% small mCx (~2 mm). normal EF.   Gastroparesis    Insomnia    Migraine    Osteopenia    Postmenopausal    Tobacco use disorder    Vitamin D deficiency    Weight loss     Current Outpatient Medications:    aspirin EC 81 MG tablet, Take 81 mg by mouth daily., Disp: , Rfl:    Cholecalciferol (VITAMIN D) 2000 units CAPS, Take 1 capsule by mouth daily. 1 times a week, Disp: , Rfl:    albuterol (VENTOLIN HFA) 108 (90 Base) MCG/ACT inhaler, Inhale 2 puffs into the lungs every 6 (six) hours as needed for wheezing., Disp: 1 each, Rfl: 2   atorvastatin (LIPITOR) 20 MG tablet, Take 1 tablet (20 mg total) by mouth daily., Disp: 90 tablet, Rfl: 3   bisoprolol (ZEBETA) 5 MG tablet, Take 0.5 tablets (2.5 mg total) by mouth daily., Disp: 45 tablet, Rfl: 3   zonisamide (ZONEGRAN) 100 MG capsule, Take 2  capsules (200 mg total) by mouth daily., Disp: 180 capsule, Rfl: 3 Social History   Socioeconomic History   Marital status: Legally Separated    Spouse name: Not on file   Number of children: 6   Years of education: Not on file   Highest education level: Not on file  Occupational History   Occupation: custodian    Employer: [employer]  Tobacco Use   Smoking status: Every Day    Packs/day: 0.50    Types: Cigarettes   Smokeless tobacco: Never   Tobacco comments:    form given 05-30-13  Vaping Use   Vaping Use: Never used  Substance and Sexual Activity   Alcohol use: No   Drug use: No   Sexual activity: Not on file  Other Topics Concern   Not on file  Social History Narrative   Not on file   Social Determinants of Health   Financial Resource Strain: Not on file  Food Insecurity: Not on file  Transportation Needs: Not on file  Physical Activity: Not on file  Stress: Not on file  Social Connections: Not on file  Intimate Partner Violence: Not on file   Family History  Problem Relation Age of Onset   Diabetes Mother    Lung cancer Father    Heart attack Brother 25   Other Brother  Triple Bypass   Heart attack Brother    Breast cancer Paternal Grandmother    Skin cancer Maternal Grandmother    Diabetes Maternal Grandfather    Heart attack Sister    Other Sister        blocked arteries   Hypertension Son    Hypertension Son    Colon cancer Neg Hx    Throat cancer Neg Hx    Stomach cancer Neg Hx    Liver disease Neg Hx    Kidney disease Neg Hx     Objective: Office vital signs reviewed. BP 127/80   Pulse 69   Temp 97.9 F (36.6 C)   Ht 4\' 11"  (1.499 m)   Wt 112 lb (50.8 kg)   SpO2 96%   BMI 22.62 kg/m   Physical Examination:  General: Awake, alert, well nourished, No acute distress HEENT: Normal ; sclera white.  TMs intact bilaterally but right TM does show a small clear effusion.  There is no bulging, perforation,  erythema. Cardio: regular rate and rhythm, S1S2 heard, no murmurs appreciated Pulm: clear to auscultation bilaterally, no wheezes, rhonchi or rales; normal work of breathing on room air Extremities: warm, well perfused, No edema, cyanosis or clubbing; +2 pulses bilaterally MSK: normal gait and station.  Ambulating independently. Neuro: No focal neurologic deficits.  No nystagmus.  Assessment/ Plan: 60 y.o. female   Fluid level behind tympanic membrane of right ear  Coronary artery disease, non-occlusive - Plan: atorvastatin (LIPITOR) 20 MG tablet, bisoprolol (ZEBETA) 5 MG tablet  Migraine without aura and without status migrainosus, not intractable - Plan: zonisamide (ZONEGRAN) 100 MG capsule  Discussed use of Claritin for what appears to be an effusion behind the right TM.  No evidence of infection.  She will follow-up with me if symptoms or not improving  Labs were stable.  She will continue Lipitor, bisoprolol.  Migraine headaches are stable sonogram.  This has been renewed  Orders Placed This Encounter  Procedures   Varicella-zoster vaccine IM (Shingrix)   Meds ordered this encounter  Medications   albuterol (VENTOLIN HFA) 108 (90 Base) MCG/ACT inhaler    Sig: Inhale 2 puffs into the lungs every 6 (six) hours as needed for wheezing.    Dispense:  1 each    Refill:  2   atorvastatin (LIPITOR) 20 MG tablet    Sig: Take 1 tablet (20 mg total) by mouth daily.    Dispense:  90 tablet    Refill:  3   bisoprolol (ZEBETA) 5 MG tablet    Sig: Take 0.5 tablets (2.5 mg total) by mouth daily.    Dispense:  45 tablet    Refill:  3   zonisamide (ZONEGRAN) 100 MG capsule    Sig: Take 2 capsules (200 mg total) by mouth daily.    Dispense:  180 capsule    Refill:  3     Susan Fischer 66, DO [facility] [phone]

## 2020-09-29 ENCOUNTER — Other Ambulatory Visit: Payer: Self-pay

## 2020-09-29 ENCOUNTER — Ambulatory Visit (INDEPENDENT_AMBULATORY_CARE_PROVIDER_SITE_OTHER): Payer: 59

## 2020-09-29 DIAGNOSIS — M85851 Other specified disorders of bone density and structure, right thigh: Secondary | ICD-10-CM

## 2020-09-29 DIAGNOSIS — M8588 Other specified disorders of bone density and structure, other site: Secondary | ICD-10-CM

## 2020-09-30 ENCOUNTER — Other Ambulatory Visit: Payer: Self-pay | Admitting: Family Medicine

## 2020-09-30 DIAGNOSIS — Z1231 Encounter for screening mammogram for malignant neoplasm of breast: Secondary | ICD-10-CM

## 2021-03-20 ENCOUNTER — Encounter: Payer: Self-pay | Admitting: Cardiology

## 2021-03-20 ENCOUNTER — Other Ambulatory Visit: Payer: Self-pay

## 2021-03-20 ENCOUNTER — Ambulatory Visit (INDEPENDENT_AMBULATORY_CARE_PROVIDER_SITE_OTHER): Payer: 59 | Admitting: Cardiology

## 2021-03-20 VITALS — BP 132/78 | HR 82 | Ht 59.0 in | Wt 115.0 lb

## 2021-03-20 DIAGNOSIS — E785 Hyperlipidemia, unspecified: Secondary | ICD-10-CM

## 2021-03-20 DIAGNOSIS — Z8679 Personal history of other diseases of the circulatory system: Secondary | ICD-10-CM | POA: Diagnosis not present

## 2021-03-20 DIAGNOSIS — I10 Essential (primary) hypertension: Secondary | ICD-10-CM | POA: Diagnosis not present

## 2021-03-20 DIAGNOSIS — I2510 Atherosclerotic heart disease of native coronary artery without angina pectoris: Secondary | ICD-10-CM

## 2021-03-20 DIAGNOSIS — F17200 Nicotine dependence, unspecified, uncomplicated: Secondary | ICD-10-CM

## 2021-03-20 DIAGNOSIS — R69 Illness, unspecified: Secondary | ICD-10-CM | POA: Diagnosis not present

## 2021-03-20 NOTE — Patient Instructions (Addendum)
Medication Instructions:   No changes  *If you need a refill on your cardiac medications before your next appointment, please call your pharmacy*   Lab Work:in one month  ( March 2023)  Lipid Cmp [id number]  If you have labs (blood work) drawn today and your tests are completely normal, you will receive your results only by: MyChart Message (if you have MyChart) OR A paper copy in the mail If you have any lab test that is abnormal or we need to change your treatment, we will call you to review the results.   Testing/Procedures: Not needed     Follow-Up: At [facility], you and your health needs are our priority.  As part of our continuing mission to provide you with exceptional heart care, we have created designated Provider Care Teams.  These Care Teams include your primary Cardiologist (physician) and Advanced Practice Providers (APPs -  Physician Assistants and Nurse Practitioners) who all work together to provide you with the care you need, when you need it.     Your next appointment:   6 month(s)  The format for your next appointment:   In Person  Provider:     With Juanda Crumble PA  or Joni Reining DNP ,  Your physician recommends that you schedule a follow-up appointment in: 12 months Bryan Lemma, MD

## 2021-03-20 NOTE — Progress Notes (Signed)
Primary Care Provider: Raliegh Ip, DO [facility] Cardiologist: Bryan Lemma, MD Electrophysiologist: None  Clinic Note: Chief Complaint  Patient presents with   Follow-up    Annual visit.  Only thing that she really notes is some dizziness.   Coronary Artery Disease    Nonocclusive disease by cath.  No anginal symptoms.    ===================================  ASSESSMENT/PLAN   Problem List Items Addressed This Visit       Cardiology Problems   Coronary artery disease, non-occlusive (Chronic)    Patient with history of cardiac cath showing moderate disease of the RCA.  No symptoms of angina reported over the last year.  Does report episodes of hypotension in the afternoons.  She reports poor p.o. intake so feel this is likely related to bisoprolol along with poor hydration status. - Continue aspirin 81 mg daily - Continue atorvastatin 20 mg daily - Continue bisoprolol but transition from taking in the morning to taking in the afternoon. - Encouraged adequate hydration in the afternoon as well - CMP in 1 month - Lipid panel in 1 month   No further chest pain symptoms.  Nonocclusive disease seen on cath.  Continue current restart modification with blood pressure and lipid control.  Cannot push blood pressure control further with hypotension at home.      Relevant Orders   EKG 12-Lead   Lipid panel   Comprehensive metabolic panel   Hemoglobin A1c   Hyperlipidemia with target low density lipoprotein (LDL) cholesterol less than 70 mg/dL (Chronic)    No recent blood work.  Patient not fasting today.  Denies any myalgias.  Continuing on current dose of statin and lipid panel in 1 month.   Previously well controlled labs.  Will order labs for this month.  Continue statin.        Relevant Orders   EKG 12-Lead   Lipid panel   Comprehensive metabolic panel   Hemoglobin A1c   Essential hypertension (Chronic)    Blood pressure pretty well controlled today,  states that she does have some low pressures at home.  She acknowledges that she does not do good enough job staying adequately hydrated.  Discussed importance of maintaining hydration least 70 to 80 ounces a day.  Also increasing protein nutrition intake.  Supplemental shakes. Would not push further than current doses of bisoprolol.  Switch to p.m. dosing.        Other   History of orthostatic hypotension - Primary    She reports blood pressures dropping into the 70s in the afternoons.  Low-dose of bisoprolol.  Unlikely that this is the cause.  However we will switch her dosing to dinnertime and encouraged increased p.o. hydration/nutrition.      Tobacco use disorder (Chronic)    Discussed smoking with the patient.  She reports she is cut back and is currently smoking around half a pack of cigarettes daily.  She reports at 1 point she was smoking 3 packs of cigarettes daily.  Advised to discontinue working but patient does not seem interested at this time.  She will continue to try and cut back.      Relevant Orders   EKG 12-Lead   Lipid panel   Comprehensive metabolic panel   Hemoglobin A1c    ===================================  HPI:    Susan Fischer is a 61 y.o. female (long-term heavy smoker) with a PMH notable for moderate-nonobstructive CAD (on June 2018), with significant family history of early CAD (brother had MI in  his 70s) who presents today for a ~1 year follow-up.  [name] was last seen on in January 2022.  She was seen there is a 1 year follow-up.  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  No angina or heart failure symptoms.  Only dyspnea if she rushes up steps.  Random palpitations, rare.  Still smoking a pack a day.  Recent Hospitalizations: None  Reviewed  CV studies:    The following studies were reviewed today:  Left heart cath 07/21/2016 - Prox RCA lesion, 30 %stenosed. Dist RCA lesion, 65  %stenosed. - Tandem lesions evaluated with FFR - 0.88. Physiologically not significant. -Mid Cx lesion, 60 %stenosed. - There are some bruises with this appears to be maybe more significant, others look minimal. The vessel itself is roughly 2 mm at maximum diameter. Not a great PCI target - The left ventricular systolic function is normal. The left ventricular ejection fraction is 55-65% by visual estimate. - LV end diastolic pressure is normal.   Interval History:   Susan Fischer presents here for annual follow-up.  She was seen last January. Since that office visit she has had no episodes of chest pain and no hospitalizations.  She has been doing well on her current medications.  Patient does report that her normal blood pressures are in the 120s/60s but in the afternoons her blood pressures may be in the 70s/50s.  She feels extremely tired and will occasionally get dizzy upon standing.  Denies any chest pain during these periods.  Reports that she does not drink much fluid during the day and may only have 2 cups of coffee.  She also drinks 1 Ensure in the morning.  No major complaints.  Still has a little of the positional dizziness and exertional dyspnea if overdoing it.  Dizziness is probably related to the fact she is just not doing a good job maintaining adequate hydration.  Acknowledges that she needs to quit smoking, but still continues to smoke almost a pack a day. => Not really interested in talking to smoking station.  CV Review of Symptoms (Summary) Cardiovascular ROS: positive for - dyspnea on exertion, edema, and palpitations negative for - chest pain or loss of consciousness  REVIEWED OF SYSTEMS   Review of Systems  Constitutional:  Negative for fever and weight loss.  Eyes:  Negative for blurred vision.  Respiratory:  Positive for cough (Chronic cough) and shortness of breath (Occasionally on exertion).   Cardiovascular:  Positive for palpitations (Feels palpitations  occasionally when having a low blood pressures in the afternoon) and leg swelling (Chronic leg swelling). Negative for chest pain.  Neurological:  Negative for headaches.  -> Other than the dizziness and some palpitations, review of symptoms essentially stable.  Pertinent symptoms noted above.  I have reviewed and (if needed) personally updated the patient's problem list, medications, allergies, past medical and surgical history, social and family history.   PAST MEDICAL HISTORY   Past Medical History:  Diagnosis Date   Anorexia 12/11/2012   Arthritis    Coronary artery disease, non-occlusive 07/2016   65% d RCA (FFR 0.88), ~60% small mCx (~2 mm). normal EF.   Gastroparesis    Insomnia    Migraine    Osteopenia    Postmenopausal    Tobacco use disorder    Vitamin D deficiency    Weight loss     PAST SURGICAL HISTORY   Past  Surgical History:  Procedure Laterality Date   ABDOMINAL HYSTERECTOMY     COLONOSCOPY  2011   [place]   INTRAVASCULAR PRESSURE WIRE/FFR STUDY N/A 07/21/2016   Procedure: Intravascular Pressure Wire/FFR Study;  Surgeon: Leonie Man, MD;  Location: [facility];  Service: Cardiovascular;  Laterality: RCA - FFR 0.88 - Not Significant   LEFT HEART CATH AND CORONARY ANGIOGRAPHY N/A 07/21/2016   Procedure: Left Heart Cath and Coronary Angiography;  Surgeon: Leonie Man, MD;  Location: [facility]:  pRCA ~30%, dRCA 65% (FFR 0.88), mCx (small ~6mm) ~60%.  EF 55-65%. Normal LVEDP    Immunization History  Administered Date(s) Administered   Influenza,inj,Quad PF,6+ Mos 11/24/2012   Tdap 12/10/2010   Zoster Recombinat (Shingrix) 10/21/2017, 09/08/2020    MEDICATIONS/ALLERGIES   Current Meds  Medication Sig   albuterol (VENTOLIN HFA) 108 (90 Base) MCG/ACT inhaler Inhale 2 puffs into the lungs every 6 (six) hours as needed for wheezing.   aspirin EC 81 MG tablet Take 81 mg by mouth daily.   atorvastatin (LIPITOR) 20 MG tablet Take 1 tablet (20  mg total) by mouth daily.   bisoprolol (ZEBETA) 5 MG tablet Take 0.5 tablets (2.5 mg total) by mouth daily.   zonisamide (ZONEGRAN) 100 MG capsule Take 2 capsules (200 mg total) by mouth daily.    Allergies  Allergen Reactions   Codeine    Hydrocodone    Robitussin Cough-Cold D [Pseudoephedrine-Dm-Gg]     SOCIAL HISTORY/FAMILY HISTORY   Reviewed in Epic:   Social History   Tobacco Use   Smoking status: Every Day    Packs/day: 0.50    Types: Cigarettes   Smokeless tobacco: Never   Tobacco comments:    form given 05-30-13  Vaping Use   Vaping Use: Never used  Substance Use Topics   Alcohol use: No   Drug use: No   Social History   Social History Narrative   Not on file   Family History  Problem Relation Age of Onset   Diabetes Mother    Lung cancer Father    Heart attack Brother 36   Other Brother        Triple Bypass   Heart attack Brother    Breast cancer Paternal Grandmother    Skin cancer Maternal Grandmother    Diabetes Maternal Grandfather    Heart attack Sister    Other Sister        blocked arteries   Hypertension Son    Hypertension Son    Colon cancer Neg Hx    Throat cancer Neg Hx    Stomach cancer Neg Hx    Liver disease Neg Hx    Kidney disease Neg Hx     OBJCTIVE -PE, EKG, labs   Wt Readings from Last 3 Encounters:  03/20/21 115 lb (52.2 kg)  09/08/20 112 lb (50.8 kg)  07/15/20 110 lb (49.9 kg)    Physical Exam: BP 132/78    Pulse 82    Ht 4\' 11"  (1.499 m)    Wt 115 lb (52.2 kg)    SpO2 98%    BMI 23.23 kg/m  Physical Exam Vitals reviewed.  Constitutional:      Appearance: Normal appearance.  HENT:     Head: Normocephalic and atraumatic.     Nose: Nose normal.     Mouth/Throat:     Mouth: Mucous membranes are moist.  Eyes:     Extraocular Movements: Extraocular movements intact.  Pupils: Pupils are equal, round, and reactive to light.  Cardiovascular:     Rate and Rhythm: Normal rate and regular rhythm.     Pulses:  Normal pulses.     Heart sounds: Normal heart sounds. No murmur heard.   No friction rub. No gallop.  Pulmonary:     Effort: Pulmonary effort is normal.     Breath sounds: No stridor. Wheezing (Occasional expiratory wheeze) present. No rhonchi.  Musculoskeletal:     Cervical back: Normal range of motion.     Right lower leg: Edema present.     Left lower leg: Edema present.  Skin:    General: Skin is warm.     Capillary Refill: Capillary refill takes less than 2 seconds.  Neurological:     General: No focal deficit present.     Mental Status: She is alert.  Psychiatric:        Mood and Affect: Mood normal.   Normal cardiac exam.  No murmurs rubs or gallops. Minimal expiratory wheeze.   Adult ECG Report  Rate: 82 ;  Rhythm: normal sinus rhythm;   Narrative Interpretation: Occasional PVCs noted on EKG today -> Report confirmed, signed  Recent Labs:   Has not had labs checked in 2 years. Lab Results  Component Value Date   CHOL 121 04/18/2019   HDL 63 04/18/2019   LDLCALC 46 04/18/2019   TRIG 55 04/18/2019   CHOLHDL 1.9 04/18/2019   Lab Results  Component Value Date   CREATININE 0.73 04/18/2019   BUN 13 04/18/2019   NA 143 04/18/2019   K 4.2 04/18/2019   CL 108 (H) 04/18/2019   CO2 22 04/18/2019   CBC Latest Ref Rng & Units 04/18/2019 06/17/2017 07/16/2016  WBC 3.4 - 10.8 x10E3/uL 5.1 4.9 5.6  Hemoglobin 11.1 - 15.9 g/dL 84.613.5 96.213.0 95.214.1  Hematocrit 34.0 - 46.6 % 39.9 39.2 42.8  Platelets 150 - 450 x10E3/uL 172 182 206    No results found for: HGBA1C Lab Results  Component Value Date   TSH 3.330 06/25/2016    ==================================================  COVID-19 Education: The signs and symptoms of COVID-19 were discussed with the patient and how to seek care for testing (follow up with PCP or arrange E-visit).    The Resident Physician spent a total of 15 minutes with the patient spent in direct patient consultation.  Additional time spent with chart  review  / charting (studies, outside notes, etc): 10 min  Time Spent Directly with Patient:   I have spent a total of  10 minutes with the patient reviewing hospital notes, telemetry, EKGs, labs and examining the patient as well as establishing an assessment and plan that was discussed personally with the patient.  > 50% of time was spent in direct patient care.  Total Time: 35 min  Current medicines are reviewed at length with the patient today.  (+/- concerns) none  This visit occurred during the SARS-CoV-2 public health emergency.  Safety protocols were in place, including screening questions prior to the visit, additional usage of staff PPE, and extensive cleaning of exam room while observing appropriate contact time as indicated for disinfecting solutions.  Notice: This dictation was prepared with Dragon dictation along with smart phrase technology. Any transcriptional errors that result from this process are unintentional and may not be corrected upon review.   Studies Ordered:  Orders Placed This Encounter  Procedures   Lipid panel   Comprehensive metabolic panel   Hemoglobin A1c   EKG  12-Lead    Patient Instructions / Medication Changes & Studies & Tests Ordered   Patient Instructions  Medication Instructions:   No changes  *If you need a refill on your cardiac medications before your next appointment, please call your pharmacy*   Lab Work:in one month  ( March 2023)  Lipid Cmp hgbA1c  If you have labs (blood work) drawn today and your tests are completely normal, you will receive your results only by: MyChart Message (if you have MyChart) OR A paper copy in the mail If you have any lab test that is abnormal or we need to change your treatment, we will call you to review the results.   Testing/Procedures: Not needed     Follow-Up: At [facility], you and your health needs are our priority.  As part of our continuing mission to provide you with  exceptional heart care, we have created designated Provider Care Teams.  These Care Teams include your primary Cardiologist (physician) and Advanced Practice Providers (APPs -  Physician Assistants and Nurse Practitioners) who all work together to provide you with the care you need, when you need it.     Your next appointment:   6 month(s)  The format for your next appointment:   In Person  Provider:     With Caron Presume PA  or Jory Sims DNP ,  Your physician recommends that you schedule a follow-up appointment in: 12 months Glenetta Hew, MD       ATTENDING ATTESTATION  I have seen, examined and evaluated the patient this AM in clinic along with Dr. Caron Presume (Resident MD- [facility]).  After reviewing all the available data and chart, we discussed the patients laboratory, study & physical findings as well as symptoms in detail. I agree with his findings, examination as well as impression recommendations as per our discussion.    Attending adjustments noted in italics.   Stable symptomatically from cardiac standpoint, but has having some low blood pressures.  Adjust timing interval of bisoprolol dose.  Increase p.o. hydration and nutrition.  Low threshold to wean off antihypertensive.    Glenetta Hew, M.D., M.S. Interventional Cardiologist   Pager # [phone] Phone # [phone] [address] [place], [place] [address]     Thank you for choosing [facility] at [facility]!!

## 2021-03-20 NOTE — Assessment & Plan Note (Signed)
Discussed smoking with the patient.  She reports she is cut back and is currently smoking around half a pack of cigarettes daily.  She reports at 1 point she was smoking 3 packs of cigarettes daily.  Advised to discontinue working but patient does not seem interested at this time.  She will continue to try and cut back.

## 2021-03-20 NOTE — Assessment & Plan Note (Addendum)
No recent blood work.  Patient not fasting today.  Denies any myalgias.  Continuing on current dose of statin and lipid panel in 1 month.   Previously well controlled labs.  Will order labs for this month.  Continue statin.

## 2021-03-20 NOTE — Assessment & Plan Note (Addendum)
Patient with history of cardiac cath showing moderate disease of the RCA.  No symptoms of angina reported over the last year.  Does report episodes of hypotension in the afternoons.  She reports poor p.o. intake so feel this is likely related to bisoprolol along with poor hydration status. - Continue aspirin 81 mg daily - Continue atorvastatin 20 mg daily - Continue bisoprolol but transition from taking in the morning to taking in the afternoon. - Encouraged adequate hydration in the afternoon as well - CMP in 1 month - Lipid panel in 1 month   No further chest pain symptoms.  Nonocclusive disease seen on cath.  Continue current restart modification with blood pressure and lipid control.  Cannot push blood pressure control further with hypotension at home.

## 2021-03-21 ENCOUNTER — Encounter: Payer: Self-pay | Admitting: Cardiology

## 2021-03-21 DIAGNOSIS — Z8679 Personal history of other diseases of the circulatory system: Secondary | ICD-10-CM | POA: Insufficient documentation

## 2021-03-21 NOTE — Assessment & Plan Note (Signed)
Blood pressure pretty well controlled today, states that she does have some low pressures at home.  She acknowledges that she does not do good enough job staying adequately hydrated.  Discussed importance of maintaining hydration least 70 to 80 ounces a day.  Also increasing protein nutrition intake.  Supplemental shakes. Would not push further than current doses of bisoprolol.  Switch to p.m. dosing.

## 2021-03-21 NOTE — Assessment & Plan Note (Signed)
She reports blood pressures dropping into the 70s in the afternoons.  Low-dose of bisoprolol.  Unlikely that this is the cause.  However we will switch her dosing to dinnertime and encouraged increased p.o. hydration/nutrition.

## 2021-03-21 NOTE — Progress Notes (Signed)
° ° °  ATTENDING ATTESTATION  I have seen, examined and evaluated the patient along with the Resident Physician in clinic today.  I personally performed my own interview & exanimation.  After reviewing all the available data and chart, we discussed the patients laboratory, study & physical findings as well as symptoms in detail. I agree with his findings, examination as well as impression recommendations as per our discussion.    Attending adjustments int the full clinic noted annotated in italics.   Coronary artery disease, non-occlusive Patient with history of cardiac cath showing moderate disease of the RCA.  No symptoms of angina reported over the last year.  Does report episodes of hypotension in the afternoons.  She reports poor p.o. intake so feel this is likely related to bisoprolol along with poor hydration status. - Continue aspirin 81 mg daily - Continue atorvastatin 20 mg daily - Continue bisoprolol but transition from taking in the morning to taking in the afternoon. - Encouraged adequate hydration in the afternoon as well - CMP in 1 month - Lipid panel in 1 month   No further chest pain symptoms.  Nonocclusive disease seen on cath.  Continue current restart modification with blood pressure and lipid control.  Cannot push blood pressure control further with hypotension at home.  Hyperlipidemia with target low density lipoprotein (LDL) cholesterol less than 70 mg/dL No recent blood work.  Patient not fasting today.  Denies any myalgias.  Continuing on current dose of statin and lipid panel in 1 month.   Previously well controlled labs.  Will order labs for this month.  Continue statin.    Tobacco use disorder Discussed smoking with the patient.  She reports she is cut back and is currently smoking around half a pack of cigarettes daily.  She reports at 1 point she was smoking 3 packs of cigarettes daily.  Advised to discontinue working but patient does not seem interested at  this time.  She will continue to try and cut back.  Essential hypertension Blood pressure pretty well controlled today, states that she does have some low pressures at home.  She acknowledges that she does not do good enough job staying adequately hydrated.  Discussed importance of maintaining hydration least 70 to 80 ounces a day.  Also increasing protein nutrition intake.  Supplemental shakes. Would not push further than current doses of bisoprolol.  Switch to p.m. dosing.  History of orthostatic hypotension She reports blood pressures dropping into the 70s in the afternoons.  Low-dose of bisoprolol.  Unlikely that this is the cause.  However we will switch her dosing to dinnertime and encouraged increased p.o. hydration/nutrition.   13-month follow-up with APP;  1 year with MD.    Bryan Lemma, M.D., M.S. Interventional Cardiologist   Pager # [phone] Phone # [phone] [address] [place], [place] [address]

## 2021-11-10 ENCOUNTER — Other Ambulatory Visit: Payer: Self-pay | Admitting: Family Medicine

## 2021-11-10 DIAGNOSIS — I2510 Atherosclerotic heart disease of native coronary artery without angina pectoris: Secondary | ICD-10-CM

## 2021-11-10 DIAGNOSIS — G43009 Migraine without aura, not intractable, without status migrainosus: Secondary | ICD-10-CM

## 2021-11-30 ENCOUNTER — Telehealth: Payer: Self-pay | Admitting: Family Medicine

## 2021-11-30 DIAGNOSIS — I2510 Atherosclerotic heart disease of native coronary artery without angina pectoris: Secondary | ICD-10-CM

## 2021-11-30 DIAGNOSIS — G43009 Migraine without aura, not intractable, without status migrainosus: Secondary | ICD-10-CM

## 2021-11-30 MED ORDER — ATORVASTATIN CALCIUM 20 MG PO TABS: 20.0000 mg | ORAL_TABLET | Freq: Every day | ORAL | 0 refills | Status: DC

## 2021-11-30 MED ORDER — ZONISAMIDE 100 MG PO CAPS: 200.0000 mg | ORAL_CAPSULE | Freq: Every day | ORAL | 0 refills | Status: DC

## 2021-11-30 NOTE — Telephone Encounter (Signed)
Pt aware refills sent to pharmacy 

## 2021-11-30 NOTE — Telephone Encounter (Signed)
Patient has appointment scheduled for (12/30/2021 [facility]) and will need medication refilled for (zonisamide (ZONEGRAN) 100 MG capsule atorvastatin (LIPITOR) 20 MG tablet and ) until his appointment date.  Please advise. Use [facility]

## 2021-12-30 ENCOUNTER — Encounter: Payer: Self-pay | Admitting: Family Medicine

## 2021-12-30 ENCOUNTER — Ambulatory Visit (INDEPENDENT_AMBULATORY_CARE_PROVIDER_SITE_OTHER): Payer: 59 | Admitting: Family Medicine

## 2021-12-30 VITALS — BP 117/59 | HR 75 | Temp 97.9°F | Ht 59.0 in | Wt 110.0 lb

## 2021-12-30 DIAGNOSIS — G43009 Migraine without aura, not intractable, without status migrainosus: Secondary | ICD-10-CM

## 2021-12-30 DIAGNOSIS — I2510 Atherosclerotic heart disease of native coronary artery without angina pectoris: Secondary | ICD-10-CM

## 2021-12-30 DIAGNOSIS — E785 Hyperlipidemia, unspecified: Secondary | ICD-10-CM

## 2021-12-30 DIAGNOSIS — F17200 Nicotine dependence, unspecified, uncomplicated: Secondary | ICD-10-CM

## 2021-12-30 DIAGNOSIS — Z636 Dependent relative needing care at home: Secondary | ICD-10-CM

## 2021-12-30 DIAGNOSIS — R69 Illness, unspecified: Secondary | ICD-10-CM | POA: Diagnosis not present

## 2021-12-30 MED ORDER — ALBUTEROL SULFATE HFA 108 (90 BASE) MCG/ACT IN AERS: 2.0000 | INHALATION_SPRAY | Freq: Four times a day (QID) | RESPIRATORY_TRACT | 2 refills | Status: DC | PRN

## 2021-12-30 MED ORDER — ATORVASTATIN CALCIUM 20 MG PO TABS: 20.0000 mg | ORAL_TABLET | Freq: Every day | ORAL | 3 refills | Status: DC

## 2021-12-30 MED ORDER — BISOPROLOL FUMARATE 5 MG PO TABS: 2.5000 mg | ORAL_TABLET | Freq: Every day | ORAL | 3 refills | Status: DC

## 2021-12-30 MED ORDER — ZONISAMIDE 100 MG PO CAPS: 200.0000 mg | ORAL_CAPSULE | Freq: Every day | ORAL | 3 refills | Status: DC
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
## 2021-12-30 NOTE — Patient Instructions (Signed)
Lung Cancer Screening A lung cancer screening is a test that checks for lung cancer when there are no symptoms or history of that disease. The screening is done to look for lung cancer in its very early stages. Finding cancer early improves the chances of successful treatment. It may save your life. Who should have a screening? You should be screened for lung cancer if all of these apply: You currently smoke, or you have quit smoking within the past 15 years. You are between the ages of 50 and 80 years old. Screening may be recommended up to age 80 depending on your overall health and other factors. You have a smoking history of 1 pack of cigarettes a day for 20 years or 2 packs a day for 10 years. How is screening done?  The recommended screening test is a low-dose computed tomography (LDCT) scan. This scan takes detailed images of the lungs. This allows a health care provider to look for abnormal cells. If you are at risk for lung cancer, it is recommended that you get screened once a year. Talk to your health care provider about the risks, benefits, and limitations of screening. What are the benefits of screening? Screening can find lung cancer early, before symptoms start and before it has spread outside of the lungs. The chances of curing lung cancer are greater if the cancer is diagnosed early. What are the risks of screening? The screening may show lung cancer when no cancer is present. Talk with your health care provider about what your results mean. In some cases, your health care provider may do more testing to confirm the results. The screening may not find lung cancer when it is present. You will be exposed to radiation from repeated LDCT tests, which can cause cancer in otherwise healthy people. How can I lower my risk of lung cancer? Make these lifestyle changes to lower your risk of developing lung cancer: Do not use any products that contain nicotine or tobacco. These products  include cigarettes, chewing tobacco, and vaping devices, such as e-cigarettes. If you need help quitting, ask your health care provider. Avoid secondhand smoke. Avoid exposure to radiation. Avoid exposure to radon gas. Have your home checked for radon regularly. Avoid things that cause cancer (carcinogens). Avoid living or working in places with high air pollution or diesel exhaust. Questions to ask your health care provider Am I eligible for lung cancer screening? Does my health insurance cover the cost of lung cancer screening? What happens if the lung cancer screening shows something of concern? How soon will I have results from my lung cancer screening? Is there anything that I need to do to prepare for my lung cancer screening? What happens if I decide not to have lung cancer screening? Where to find more information Ask your health care provider about the risks and benefits of screening. More information and resources are available from these organizations: American Cancer Society (ACS): www.cancer.org American Lung Association: www.lung.org National Cancer Institute: www.cancer.gov Contact a health care provider if: You start to show symptoms of lung cancer, including: A cough that will not go away. High-pitched whistling sounds when you breathe, most often when you breathe out (wheezing). Chest pain. Coughing up blood. Shortness of breath. Weight loss that cannot be explained. Constant tiredness (fatigue). Hoarse voice. Summary Lung cancer screening may find lung cancer before symptoms appear. Finding cancer early improves the chances of successful treatment. It may save your life. The recommended screening test is a low-dose   computed tomography (LDCT) scan that looks for abnormal cells in the lungs. If you are at risk for lung cancer, it is recommended that you get screened once a year. You can make lifestyle changes to lower your risk of lung cancer. Ask your health care  provider about the risks and benefits of screening. This information is not intended to replace advice given to you by your health care provider. Make sure you discuss any questions you have with your health care provider. Document Revised: 07/16/2020 Document Reviewed: 07/16/2020 Elsevier Patient Education  2023 Elsevier Inc.  

## 2021-12-31 LAB — CMP14+EGFR
ALT: 11 IU/L (ref 0–32)
AST: 22 IU/L (ref 0–40)
Albumin/Globulin Ratio: 2.4 — ABNORMAL HIGH (ref 1.2–2.2)
Albumin: 4.6 g/dL (ref 3.9–4.9)
Alkaline Phosphatase: 63 IU/L (ref 44–121)
BUN/Creatinine Ratio: 16 (ref 12–28)
BUN: 12 mg/dL (ref 8–27)
Bilirubin Total: 0.2 mg/dL (ref 0.0–1.2)
CO2: 24 mmol/L (ref 20–29)
Calcium: 9.4 mg/dL (ref 8.7–10.3)
Chloride: 103 mmol/L (ref 96–106)
Creatinine, Ser: 0.75 mg/dL (ref 0.57–1.00)
Globulin, Total: 1.9 g/dL (ref 1.5–4.5)
Glucose: 80 mg/dL (ref 70–99)
Potassium: 4.1 mmol/L (ref 3.5–5.2)
Sodium: 139 mmol/L (ref 134–144)
Total Protein: 6.5 g/dL (ref 6.0–8.5)
eGFR: 89 mL/min/{1.73_m2} (ref >59)

## 2021-12-31 LAB — CBC
Hematocrit: 41.3 % (ref 34.0–46.6)
Hemoglobin: 13.6 g/dL (ref 11.1–15.9)
MCH: 30.4 pg (ref 26.6–33.0)
MCHC: 32.9 g/dL (ref 31.5–35.7)
MCV: 92 fL (ref 79–97)
Platelets: 210 10*3/uL (ref 150–450)
RBC: 4.47 x10E6/uL (ref 3.77–5.28)
RDW: 12.2 % (ref 11.7–15.4)
WBC: 6.8 10*3/uL (ref 3.4–10.8)

## 2021-12-31 LAB — LIPID PANEL
Chol/HDL Ratio: 2.4 ratio (ref 0.0–4.4)
Cholesterol, Total: 162 mg/dL (ref 100–199)
HDL: 67 mg/dL (ref >39)
LDL Chol Calc (NIH): 80 mg/dL (ref 0–99)
Triglycerides: 77 mg/dL (ref 0–149)
VLDL Cholesterol Cal: 15 mg/dL (ref 5–40)

## 2022-05-26 ENCOUNTER — Encounter: Payer: Self-pay | Admitting: Family Medicine

## 2022-06-04 ENCOUNTER — Ambulatory Visit: Payer: 59 | Admitting: Family Medicine

## 2022-06-04 ENCOUNTER — Encounter: Payer: Self-pay | Admitting: Family Medicine

## 2022-06-04 VITALS — BP 138/75 | HR 66 | Temp 96.9°F | Ht 59.0 in | Wt 108.0 lb

## 2022-06-04 DIAGNOSIS — M79644 Pain in right finger(s): Secondary | ICD-10-CM | POA: Diagnosis not present

## 2022-06-04 DIAGNOSIS — X503XXA Overexertion from repetitive movements, initial encounter: Secondary | ICD-10-CM

## 2022-06-04 MED ORDER — PREDNISONE 20 MG PO TABS: 40.0000 mg | ORAL_TABLET | Freq: Every day | ORAL | 0 refills | Status: AC

## 2022-06-04 NOTE — Progress Notes (Signed)
Subjective:  Patient ID: Susan Fischer, female    DOB: 12-Sep-1960, 62 y.o.   MRN: [id number]  Patient Care Team: Raliegh Ip, DO as PCP - General (Family Medicine) Marykay Lex, MD as PCP - Cardiology (Cardiology)   Chief Complaint:  thumb pain (Right thumb pain and swelling x 3 weeks )   HPI: Susan Fischer is a 62 y.o. female presenting on 06/04/2022 for thumb pain (Right thumb pain and swelling x 3 weeks )   Pt presents today for the evaluation of thumb pain. States she uses scissors on a regular basis at her place of employment and she also cleans houses. States she is right handed and uses this hand for vacuuming and cutting the plants at work. States her thumb has been aching for a while but has worsened over the last 3-4 weeks. Has pain and swelling at base of thumb with stiffness. Has tried tylenol for the pain without relief.      Relevant past medical, surgical, family, and social history reviewed and updated as indicated.  Allergies and medications reviewed and updated. Data reviewed: Chart in Epic.   Past Medical History:  Diagnosis Date   Anorexia 12/11/2012   Arthritis    Coronary artery disease, non-occlusive 07/2016   65% d RCA (FFR 0.88), ~60% small mCx (~2 mm). normal EF.   Gastroparesis    Insomnia    Migraine    Osteopenia    Postmenopausal    Tobacco use disorder    Vitamin D deficiency    Weight loss     Past Surgical History:  Procedure Laterality Date   ABDOMINAL HYSTERECTOMY     COLONOSCOPY  2011   [place]   CORONARY PRESSURE/FFR STUDY N/A 07/21/2016   Procedure: Intravascular Pressure Wire/FFR Study;  Surgeon: Marykay Lex, MD;  [facility] IN[facility]AS[facility] CV LAB;  Service: Cardiovascular;  Laterality: RCA - FFR 0.88 - Not Significant   LEFT HEART CATH AND CORONARY ANGIOGRAPHY N/A 07/21/2016   Procedure: Left Heart Cath and Coronary Angiography;  Surgeon: Marykay Lex, MD;  Location: [facility] INVASIVE CV LAB:  pRCA ~30%, dRCA 65% (FFR  0.88), mCx (small ~56mm) ~60%.  EF 55-65%. Normal LVEDP    Social History   Socioeconomic History   Marital status: Legally Separated    Spouse name: Not on file   Number of children: 6   Years of education: Not on file   Highest education level: Not on file  Occupational History   Occupation: custodian    Employer: [employer]  Tobacco Use   Smoking status: Every Day    Packs/day: .5    Types: Cigarettes   Smokeless tobacco: Never   Tobacco comments:    form given 05-30-13  Vaping Use   Vaping Use: Never used  Substance and Sexual Activity   Alcohol use: No   Drug use: No   Sexual activity: Not on file  Other Topics Concern   Not on file  Social History Narrative   Not on file   Social Determinants of Health   Financial Resource Strain: Not on file  Food Insecurity: Not on file  Transportation Needs: Not on file  Physical Activity: Not on file  Stress: Not on file  Social Connections: Not on file  Intimate Partner Violence: Not on file    Outpatient Encounter Medications as of 06/04/2022  Medication Sig   albuterol (VENTOLIN HFA) 108 (90 Base) MCG/ACT inhaler Inhale 2 puffs  into the lungs every 6 (six) hours as needed for wheezing.   aspirin EC 81 MG tablet Take 81 mg by mouth daily.   atorvastatin (LIPITOR) 20 MG tablet Take 1 tablet (20 mg total) by mouth daily.   bisoprolol (ZEBETA) 5 MG tablet Take 0.5 tablets (2.5 mg total) by mouth daily.   predniSONE (DELTASONE) 20 MG tablet Take 2 tablets (40 mg total) by mouth daily with breakfast for 5 days. 2 po daily for 5 days   zonisamide (ZONEGRAN) 100 MG capsule Take 2 capsules (200 mg total) by mouth daily.   No facility-administered encounter medications on file as of 06/04/2022.    Allergies  Allergen Reactions   Codeine    Hydrocodone    Robitussin Cough-Cold D [Pseudoephedrine-Dm-Gg]     Review of Systems  Constitutional:  Negative for activity change, appetite change, chills,  diaphoresis, fatigue, fever and unexpected weight change.  HENT: Negative.    Eyes: Negative.  Negative for photophobia and visual disturbance.  Respiratory:  Negative for cough, chest tightness and shortness of breath.   Cardiovascular:  Negative for chest pain, palpitations and leg swelling.  Gastrointestinal:  Negative for abdominal pain, blood in stool, constipation, diarrhea, nausea and vomiting.  Endocrine: Negative.  Negative for polydipsia, polyphagia and polyuria.  Genitourinary:  Negative for decreased urine volume, difficulty urinating, dysuria, frequency and urgency.  Musculoskeletal:  Positive for arthralgias and joint swelling. Negative for back pain, gait problem, myalgias, neck pain and neck stiffness.  Skin: Negative.   Allergic/Immunologic: Negative.   Neurological:  Negative for dizziness and headaches.  Hematological: Negative.   Psychiatric/Behavioral:  Negative for confusion, hallucinations, sleep disturbance and suicidal ideas.   All other systems reviewed and are negative.       Objective:  BP 138/75   Pulse 66   Temp (!) 96.9 F (36.1 C) (Temporal)   Ht 4\' 11"  (1.499 m)   Wt 108 lb (49 kg)   SpO2 95%   BMI 21.81 kg/m    Wt Readings from Last 3 Encounters:  06/04/22 108 lb (49 kg)  12/30/21 110 lb (49.9 kg)  03/20/21 115 lb (52.2 kg)    Physical Exam Vitals and nursing note reviewed.  Constitutional:      General: She is not in acute distress.    Appearance: Normal appearance. She is well-developed and well-groomed. She is not ill-appearing, toxic-appearing or diaphoretic.  HENT:     Head: Normocephalic and atraumatic.     Jaw: There is normal jaw occlusion.     Right Ear: Hearing normal.     Left Ear: Hearing normal.     Nose: Nose normal.     Mouth/Throat:     Lips: Pink.     Mouth: Mucous membranes are moist.     Pharynx: Oropharynx is clear. Uvula midline.  Eyes:     General: Lids are normal.     Extraocular Movements: Extraocular  movements intact.     Conjunctiva/sclera: Conjunctivae normal.     Pupils: Pupils are equal, round, and reactive to light.  Neck:     Trachea: Trachea and phonation normal.  Cardiovascular:     Rate and Rhythm: Normal rate and regular rhythm.     Chest Wall: PMI is not displaced.     Pulses: Normal pulses.     Heart sounds: Normal heart sounds. No murmur heard.    No friction rub. No gallop.  Pulmonary:     Effort: Pulmonary effort is normal. No respiratory distress.  Breath sounds: Normal breath sounds. No wheezing.  Abdominal:     General: There is no abdominal bruit.     Palpations: There is no hepatomegaly or splenomegaly.  Musculoskeletal:     Right wrist: Normal.     Right hand: Swelling (thumb) and tenderness present. No deformity, lacerations or bony tenderness. Decreased range of motion. Decreased strength of finger abduction. Normal strength of thumb/finger opposition and wrist extension. Normal sensation. There is no disruption of two-point discrimination. Normal capillary refill. Normal pulse.     Right lower leg: No edema.     Left lower leg: No edema.  Lymphadenopathy:     Cervical: No cervical adenopathy.  Skin:    General: Skin is warm and dry.     Capillary Refill: Capillary refill takes Fischer than 2 seconds.     Coloration: Skin is not cyanotic, jaundiced or pale.     Findings: No rash.  Neurological:     General: No focal deficit present.     Mental Status: She is alert and oriented to person, place, and time.     Sensory: Sensation is intact.     Motor: Motor function is intact.     Coordination: Coordination is intact.     Gait: Gait is intact.     Deep Tendon Reflexes: Reflexes are normal and symmetric.  Psychiatric:        Attention and Perception: Attention and perception normal.        Mood and Affect: Mood and affect normal.        Speech: Speech normal.        Behavior: Behavior normal. Behavior is cooperative.        Thought Content: Thought  content normal.        Cognition and Memory: Cognition and memory normal.        Judgment: Judgment normal.     Results for orders placed or performed in visit on 12/30/21  CMP14+EGFR  Result Value Ref Range   Glucose 80 70 - 99 mg/dL   BUN 12 8 - 27 mg/dL   Creatinine, Ser 8.11 0.57 - 1.00 mg/dL   eGFR 89 >91 YN/WGN/5.62   BUN/Creatinine Ratio 16 12 - 28   Sodium 139 134 - 144 mmol/L   Potassium 4.1 3.5 - 5.2 mmol/L   Chloride 103 96 - 106 mmol/L   CO2 24 20 - 29 mmol/L   Calcium 9.4 8.7 - 10.3 mg/dL   Total Protein 6.5 6.0 - 8.5 g/dL   Albumin 4.6 3.9 - 4.9 g/dL   Globulin, Total 1.9 1.5 - 4.5 g/dL   Albumin/Globulin Ratio 2.4 (H) 1.2 - 2.2   Bilirubin Total 0.2 0.0 - 1.2 mg/dL   Alkaline Phosphatase 63 44 - 121 IU/L   AST 22 0 - 40 IU/L   ALT 11 0 - 32 IU/L  Lipid Panel  Result Value Ref Range   Cholesterol, Total 162 100 - 199 mg/dL   Triglycerides 77 0 - 149 mg/dL   HDL 67 >13 mg/dL   VLDL Cholesterol Cal 15 5 - 40 mg/dL   LDL Chol Calc (NIH) 80 0 - 99 mg/dL   Chol/HDL Ratio 2.4 0.0 - 4.4 ratio  CBC  Result Value Ref Range   WBC 6.8 3.4 - 10.8 x10E3/uL   RBC 4.47 3.77 - 5.28 x10E6/uL   Hemoglobin 13.6 11.1 - 15.9 g/dL   Hematocrit 08.6 57.8 - 46.6 %   MCV 92 79 - 97 fL   MCH  30.4 26.6 - 33.0 pg   MCHC 32.9 31.5 - 35.7 g/dL   RDW 16.1 09.6 - 04.5 %   Platelets 210 150 - 450 x10E3/uL       Pertinent labs & imaging results that were available during my care of the patient were reviewed by me and considered in my medical decision making.  Assessment & Plan:  Latasha was seen today for thumb pain.  Diagnoses and all orders for this visit:  Pain of right thumb Overuse injury Overuse injury. Will burst with steroids. Brace applied in office today. Symptomatic care discussed in detail. Report new, worsening, or persistent symptoms. Follow up in 6 weeks for reevaluation.  -     predniSONE (DELTASONE) 20 MG tablet; Take 2 tablets (40 mg total) by mouth daily with  breakfast for 5 days. 2 po daily for 5 days     Continue all other maintenance medications.  Follow up plan: Return in about 6 weeks (around 07/16/2022), or if symptoms worsen or fail to improve, for thumb pain.   Continue healthy lifestyle choices, including diet (rich in fruits, vegetables, and lean proteins, and low in salt and simple carbohydrates) and exercise (at least 30 minutes of moderate physical activity daily).  Educational handout given for trigger finger  The above assessment and management plan was discussed with the patient. The patient verbalized understanding of and has agreed to the management plan. Patient is aware to call the clinic if they develop any new symptoms or if symptoms persist or worsen. Patient is aware when to return to the clinic for a follow-up visit. Patient educated on when it is appropriate to go to the emergency department.   Kari Baars, FNP-C [facility] [phone]

## 2022-12-28 ENCOUNTER — Telehealth: Payer: Self-pay | Admitting: Family Medicine

## 2022-12-28 DIAGNOSIS — G43009 Migraine without aura, not intractable, without status migrainosus: Secondary | ICD-10-CM

## 2022-12-28 DIAGNOSIS — I2510 Atherosclerotic heart disease of native coronary artery without angina pectoris: Secondary | ICD-10-CM

## 2022-12-28 MED ORDER — ATORVASTATIN CALCIUM 20 MG PO TABS: 20.0000 mg | ORAL_TABLET | Freq: Every day | ORAL | 3 refills | Status: DC

## 2022-12-28 MED ORDER — BISOPROLOL FUMARATE 5 MG PO TABS: 2.5000 mg | ORAL_TABLET | Freq: Every day | ORAL | 3 refills | Status: DC

## 2022-12-28 MED ORDER — ZONISAMIDE 100 MG PO CAPS: 200.0000 mg | ORAL_CAPSULE | Freq: Every day | ORAL | 3 refills | Status: DC

## 2022-12-28 NOTE — Telephone Encounter (Signed)
Pt will need her medication refilled before her appt she had to reschedule her appt until feb

## 2023-01-03 ENCOUNTER — Encounter: Payer: 59 | Admitting: Family Medicine

## 2023-01-24 ENCOUNTER — Other Ambulatory Visit: Payer: Self-pay | Admitting: Family Medicine

## 2023-01-24 ENCOUNTER — Telehealth: Payer: Self-pay

## 2023-01-24 DIAGNOSIS — Z1231 Encounter for screening mammogram for malignant neoplasm of breast: Secondary | ICD-10-CM

## 2023-01-24 NOTE — Patient Outreach (Signed)
Successful call to patient on today regarding preventative mammogram screening. Patient scheduled for 01/28/23 at 3:30 p.m. with [facility] mobile bus.   Susan Fischer/VBCI  [facility] Assistant-Population Health [phone]

## 2023-01-28 ENCOUNTER — Inpatient Hospital Stay: Admission: RE | Admit: 2023-01-28 | Payer: 59 | Source: Ambulatory Visit

## 2023-02-17 ENCOUNTER — Telehealth: Payer: Self-pay | Admitting: Family Medicine

## 2023-03-02 ENCOUNTER — Inpatient Hospital Stay: Admission: RE | Admit: 2023-03-02 | Payer: 59 | Source: Ambulatory Visit

## 2023-03-28 ENCOUNTER — Ambulatory Visit: Payer: 59 | Admitting: Family Medicine

## 2023-05-04 ENCOUNTER — Ambulatory Visit
Admission: RE | Admit: 2023-05-04 | Discharge: 2023-05-04 | Disposition: A | Discharge status: Home / Self Care | Source: Ambulatory Visit | Attending: Family Medicine | Admitting: Family Medicine

## 2023-05-04 DIAGNOSIS — Z1231 Encounter for screening mammogram for malignant neoplasm of breast: Secondary | ICD-10-CM

## 2023-06-03 ENCOUNTER — Telehealth: Payer: Self-pay

## 2023-06-03 ENCOUNTER — Other Ambulatory Visit (HOSPITAL_COMMUNITY): Payer: Self-pay

## 2023-06-03 NOTE — Telephone Encounter (Signed)
 Pharmacy Patient Advocate Encounter   Received notification from Onbase that prior authorization for Bisoprolol  5 mg tablets is required/requested.   Insurance verification completed.   The patient is insured through [facility] MEDICAID .   Per test claim:  Atneolol, Coreg, Labetalol, Toprol XL, Lopressor, Bystolic, Inderal or Sotalol is preferred by the insurance.  If suggested medication is appropriate, Please send in a new RX and discontinue this one. If not, please advise as to why it's not appropriate so that we may request a Prior Authorization. Please note, some preferred medications may still require a PA.  If the suggested medications have not been trialed and there are no contraindications to their use, the PA will not be submitted, as it will not be approved.

## 2023-06-03 NOTE — Telephone Encounter (Signed)
 Please note she was placed on this med 2018 by cardiology for CAD.  I suspect there was a reason they selected this specific one.  She has been stable and I don't really want her switched off of med without cardiology input

## 2023-06-30 NOTE — Telephone Encounter (Signed)
 Insurance isn't budging on this which is crazy because is very cheap.  She can use good rx $9/month at [facility]  Must have documented reasoning for being on this vs other preferred agents.  Should we just have cardiology prescribe or adjust?   Per test claim:  Atneolol, Coreg, Labetalol, Toprol XL, Lopressor, Bystolic, Inderal or Sotalol is preferred by the insurance.  If suggested medication is appropriate, Please send in a new RX and discontinue this one. If not, please advise as to why it's not appropriate so that we may request a Prior Authorization. Please note, some preferred medications may still require a PA.  If the suggested medications have not been trialed and there are no contraindications to their use, the PA will not be submitted, as it will not be approved.

## 2023-07-01 NOTE — Telephone Encounter (Signed)
 Patient informed. LS

## 2023-07-01 NOTE — Telephone Encounter (Signed)
 Please inform patient of Julie's response below.  She can get this at [facility] for 9 bucks

## 2023-07-06 NOTE — Progress Notes (Unsigned)
 Susan Fischer is a 63 y.o. female presents to office today for annual physical exam examination.    Concerns today include: 1. Skin lesion  Reports the lesion on the left wrist seems to be bigger over the last couple of years. She is an active smoker.  Substance use: tobacco Health Maintenance Due  Topic Date Due   Medicare Annual Wellness (AWV)  Never done   Pneumonia Vaccine 31+ Years old (1 of 2 - PCV) Never done   Colonoscopy  09/19/2019   DTaP/Tdap/Td (2 - Td or Tdap) 12/09/2020   COVID-19 Vaccine (1 - 2024-25 season) Never done   Refills needed today: all  Immunization History  Administered Date(s) Administered   Influenza,inj,Quad PF,6+ Mos 11/24/2012   Tdap 12/10/2010   Zoster Recombinant(Shingrix ) 10/21/2017, 09/08/2020   Past Medical History:  Diagnosis Date   Anorexia 12/11/2012   Arthritis    Coronary artery disease, non-occlusive 07/2016   65% d RCA (FFR 0.88), ~60% small mCx (~2 mm). normal EF.   Gastroparesis    Insomnia    Migraine    Osteopenia    Postmenopausal    Tobacco use disorder    Vitamin D  deficiency    Weight loss    Social History   Socioeconomic History   Marital status: Legally Separated    Spouse name: Not on file   Number of children: 6   Years of education: Not on file   Highest education level: Not on file  Occupational History   Occupation: custodian    Employer: [employer]  Tobacco Use   Smoking status: Every Day    Current packs/day: 0.50    Types: Cigarettes   Smokeless tobacco: Never   Tobacco comments:    form given 05-30-13  Vaping Use   Vaping status: Never Used  Substance and Sexual Activity   Alcohol use: No   Drug use: No   Sexual activity: Not on file  Other Topics Concern   Not on file  Social History Narrative   Not on file   Social Drivers of Health   Financial Resource Strain: Not on file  Food Insecurity: Not on file  Transportation Needs: Not on file  Physical Activity: Not  on file  Stress: Not on file  Social Connections: Not on file  Intimate Partner Violence: Not on file   Past Surgical History:  Procedure Laterality Date   ABDOMINAL HYSTERECTOMY     COLONOSCOPY  2011   [place]   CORONARY PRESSURE/FFR STUDY N/A 07/21/2016   Procedure: Intravascular Pressure Wire/FFR Study;  Surgeon: Arleen Lacer, MD;  Location: [facility] INVASIVE CV LAB;  Service: Cardiovascular;  Laterality: RCA - FFR 0.88 - Not Significant   LEFT HEART CATH AND CORONARY ANGIOGRAPHY N/A 07/21/2016   Procedure: Left Heart Cath and Coronary Angiography;  Surgeon: Arleen Lacer, MD;  Location: [facility] INVASIVE CV LAB:  pRCA ~30%, dRCA 65% (FFR 0.88), mCx (small ~70mm) ~60%.  EF 55-65%. Normal LVEDP   Family History  Problem Relation Age of Onset   Diabetes Mother    Lung cancer Father    Heart attack Brother 36   Other Brother        Triple Bypass   Heart attack Brother    Breast cancer Paternal Grandmother    Skin cancer Maternal Grandmother    Diabetes Maternal Grandfather    Heart attack Sister    Other Sister        blocked arteries  Hypertension Son    Hypertension Son    Colon cancer Neg Hx    Throat cancer Neg Hx    Stomach cancer Neg Hx    Liver disease Neg Hx    Kidney disease Neg Hx     Current Outpatient Medications:    albuterol  (VENTOLIN  HFA) 108 (90 Base) MCG/ACT inhaler, Inhale 2 puffs into the lungs every 6 (six) hours as needed for wheezing., Disp: 1 each, Rfl: 2   aspirin  EC 81 MG tablet, Take 81 mg by mouth daily., Disp: , Rfl:    atorvastatin  (LIPITOR) 20 MG tablet, Take 1 tablet (20 mg total) by mouth daily., Disp: 90 tablet, Rfl: 3   bisoprolol  (ZEBETA ) 5 MG tablet, Take 0.5 tablets (2.5 mg total) by mouth daily., Disp: 45 tablet, Rfl: 3   zonisamide  (ZONEGRAN ) 100 MG capsule, Take 2 capsules (200 mg total) by mouth daily., Disp: 180 capsule, Rfl: 3  Allergies  Allergen Reactions   Codeine    Hydrocodone    Robitussin Cough-Cold D [Pseudoephedrine-Dm-Gg]       ROS: Review of Systems A comprehensive review of systems was negative except for: Eyes: positive for contacts/glasses Integument/breast: positive for changed mole and multiple red skin lesions on trunk Musculoskeletal: positive for bilateral progressive knee pain    Physical exam BP 124/70   Pulse 67   Temp 98.7 F (37.1 C)   Ht 4\' 11"  (1.499 m)   Wt 111 lb 9.6 oz (50.6 kg)   SpO2 95%   BMI 22.54 kg/m  General appearance: alert, cooperative, appears stated age, and no distress Head: Normocephalic, without obvious abnormality, atraumatic Eyes: negative findings: lids and lashes normal, conjunctivae and sclerae normal, corneas clear, and pupils equal, round, reactive to light and accomodation Ears: normal TM's and external ear canals both ears Nose: Nares normal. Septum midline. Mucosa normal. No drainage or sinus tenderness. Throat: Has false teeth throughout.  Gums unremarkable.  Oropharynx without any lesions.  No sublingual masses Neck: no adenopathy, supple, symmetrical, trachea midline, and thyroid not enlarged, symmetric, no tenderness/mass/nodules Back: symmetric, no curvature. ROM normal. No CVA tenderness. Lungs: clear to auscultation bilaterally Heart: regular rate and rhythm, S1, S2 normal, no murmur, click, rub or gallop Abdomen: soft, non-tender; bowel sounds normal; no masses,  no organomegaly Extremities: extremities normal, atraumatic, no cyanosis or edema Pulses: 2+ and symmetric Skin: Asymmetric color disputation of pigmented skin lesion of the left forearm.  She has multiple hemangiomas throughout the trunk.  Multiple nevi throughout the back Lymph nodes: Cervical, supraclavicular, and axillary nodes normal. Neurologic: Grossly normal           07/08/2023    8:07 AM 12/30/2021    3:28 PM 09/08/2020    1:03 PM  Depression screen PHQ 2/9  Decreased Interest 0 0 0  Down, Depressed, Hopeless 0 0 0  PHQ - 2 Score 0 0 0  Altered sleeping 0    Tired,  decreased energy 0    Change in appetite 0    Feeling bad or failure about yourself  0    Trouble concentrating 0    Moving slowly or fidgety/restless 0    Suicidal thoughts 0    PHQ-9 Score 0    Difficult doing work/chores Not difficult at all        07/08/2023    8:07 AM 12/30/2021    3:28 PM 09/08/2020    1:03 PM  GAD 7 : Generalized Anxiety Score  Nervous, Anxious, on Edge 0  0 0  Control/stop worrying 0 0 0  Worry too much - different things 0 0 0  Trouble relaxing 0 0 0  Restless 0 0 0  Easily annoyed or irritable 0 0 0  Afraid - awful might happen 0 0 0  Total GAD 7 Score 0 0 0  Anxiety Difficulty Not difficult at all Not difficult at all Not difficult at all     Assessment/ Plan: Hildreth Lower here for annual physical exam.   Annual physical exam  Chronic pain of both knees - Plan: DG Knee Bilateral Standing AP, DG Knee 1-2 Views Left, DG Knee 1-2 Views Right  Pigmented skin lesion of uncertain behavior of upper extremity - Plan: Ambulatory referral to Dermatology  Screening for colon cancer - Plan: Ambulatory referral to Gastroenterology  Essential hypertension - Plan: CMP14+EGFR  Hyperlipidemia with target low density lipoprotein (LDL) cholesterol less than 70 mg/dL - Plan: AYT01+SWFU, Lipid Panel, TSH  Coronary artery disease, non-occlusive - Plan: CMP14+EGFR, Lipid Panel, TSH, atorvastatin  (LIPITOR) 20 MG tablet, bisoprolol  (ZEBETA ) 5 MG tablet  Tobacco use disorder - Plan: CBC, albuterol  (VENTOLIN  HFA) 108 (90 Base) MCG/ACT inhaler  Vitamin D  deficiency - Plan: VITAMIN D  25 Hydroxy (Vit-D Deficiency, Fractures)  Osteopenia of neck of right femur - Plan: CMP14+EGFR, VITAMIN D  25 Hydroxy (Vit-D Deficiency, Fractures), DG [facility] DEXA  Migraine without aura and without status migrainosus, not intractable - Plan: zonisamide  (ZONEGRAN ) 100 MG capsule  Will obtain plain films of bilateral knees both standing and laterally.  I suspect that she has osteoarthritis  but we will plan for referral pending degree of severity.  Referral to dermatology for lesion on left forearm placed.  Given enlargement and asymmetric color distribution, would like to rule out any concerning features.  She would benefit from full-body exam  Referral for colon cancer screening with Dr. Sandrea Cruel  Blood pressure well-controlled.  No changes needed.  Medications have been renewed.  Reinforced smoking cessation  DEXA scan ordered to follow-up on osteopenia and vitamin D  and calcium  levels were collected today  Continue Zonegran  as directed for migraine prevention.  Migraines are currently well-controlled  Counseled on healthy lifestyle choices, including diet (rich in fruits, vegetables and lean meats and low in salt and simple carbohydrates) and exercise (at least 30 minutes of moderate physical activity daily).  Patient to follow up 6-74m  Charli Halle M. Bonnell Butcher, DO

## 2023-07-08 ENCOUNTER — Ambulatory Visit (INDEPENDENT_AMBULATORY_CARE_PROVIDER_SITE_OTHER): Admitting: Family Medicine

## 2023-07-08 ENCOUNTER — Ambulatory Visit (INDEPENDENT_AMBULATORY_CARE_PROVIDER_SITE_OTHER)

## 2023-07-08 ENCOUNTER — Encounter: Payer: Self-pay | Admitting: Family Medicine

## 2023-07-08 VITALS — BP 124/70 | HR 67 | Temp 98.7°F | Ht 59.0 in | Wt 111.6 lb

## 2023-07-08 DIAGNOSIS — I2510 Atherosclerotic heart disease of native coronary artery without angina pectoris: Secondary | ICD-10-CM

## 2023-07-08 DIAGNOSIS — M85852 Other specified disorders of bone density and structure, left thigh: Secondary | ICD-10-CM | POA: Diagnosis not present

## 2023-07-08 DIAGNOSIS — Z0001 Encounter for general adult medical examination with abnormal findings: Secondary | ICD-10-CM | POA: Diagnosis not present

## 2023-07-08 DIAGNOSIS — M25561 Pain in right knee: Secondary | ICD-10-CM | POA: Diagnosis not present

## 2023-07-08 DIAGNOSIS — M25562 Pain in left knee: Secondary | ICD-10-CM

## 2023-07-08 DIAGNOSIS — G8929 Other chronic pain: Secondary | ICD-10-CM

## 2023-07-08 DIAGNOSIS — E785 Hyperlipidemia, unspecified: Secondary | ICD-10-CM | POA: Diagnosis not present

## 2023-07-08 DIAGNOSIS — Z1211 Encounter for screening for malignant neoplasm of colon: Secondary | ICD-10-CM

## 2023-07-08 DIAGNOSIS — M85851 Other specified disorders of bone density and structure, right thigh: Secondary | ICD-10-CM

## 2023-07-08 DIAGNOSIS — Z23 Encounter for immunization: Secondary | ICD-10-CM

## 2023-07-08 DIAGNOSIS — Z780 Asymptomatic menopausal state: Secondary | ICD-10-CM | POA: Diagnosis not present

## 2023-07-08 DIAGNOSIS — G43009 Migraine without aura, not intractable, without status migrainosus: Secondary | ICD-10-CM | POA: Diagnosis not present

## 2023-07-08 DIAGNOSIS — I10 Essential (primary) hypertension: Secondary | ICD-10-CM | POA: Diagnosis not present

## 2023-07-08 DIAGNOSIS — E559 Vitamin D deficiency, unspecified: Secondary | ICD-10-CM | POA: Diagnosis not present

## 2023-07-08 DIAGNOSIS — Z0000 Encounter for general adult medical examination without abnormal findings: Secondary | ICD-10-CM

## 2023-07-08 DIAGNOSIS — L819 Disorder of pigmentation, unspecified: Secondary | ICD-10-CM | POA: Diagnosis not present

## 2023-07-08 DIAGNOSIS — M1712 Unilateral primary osteoarthritis, left knee: Secondary | ICD-10-CM | POA: Diagnosis not present

## 2023-07-08 DIAGNOSIS — F17200 Nicotine dependence, unspecified, uncomplicated: Secondary | ICD-10-CM | POA: Diagnosis not present

## 2023-07-08 LAB — LIPID PANEL

## 2023-07-08 MED ORDER — ZONISAMIDE 100 MG PO CAPS: 200.0000 mg | ORAL_CAPSULE | Freq: Every day | ORAL | 3 refills | Status: DC

## 2023-07-08 MED ORDER — BISOPROLOL FUMARATE 5 MG PO TABS: 2.5000 mg | ORAL_TABLET | Freq: Every day | ORAL | 3 refills | Status: AC

## 2023-07-08 MED ORDER — ATORVASTATIN CALCIUM 20 MG PO TABS: 20.0000 mg | ORAL_TABLET | Freq: Every day | ORAL | 3 refills | Status: AC

## 2023-07-08 MED ORDER — ALBUTEROL SULFATE HFA 108 (90 BASE) MCG/ACT IN AERS: 2.0000 | INHALATION_SPRAY | Freq: Four times a day (QID) | RESPIRATORY_TRACT | 2 refills | Status: AC | PRN

## 2023-07-08 NOTE — Patient Instructions (Signed)
Smoking Tobacco Information, Adult Smoking tobacco can be harmful to your health. Tobacco contains a toxic colorless chemical called nicotine. Nicotine causes changes in your brain that make you want more and more. This is called addiction. This can make it hard to stop smoking once you start. Tobacco also has other toxic chemicals that can hurt your body and raise your risk of many cancers. Menthol or "lite" tobacco or cigarette brands are not safer than regular brands. How can smoking tobacco affect me? Smoking tobacco puts you at risk for: Cancer. Smoking is most commonly associated with lung cancer, but can also lead to cancer in other parts of the body. Chronic obstructive pulmonary disease (COPD). This is a long-term lung condition that makes it hard to breathe. It also gets worse over time. High blood pressure (hypertension), heart disease, stroke, heart attack, and lung infections, such as pneumonia. Cataracts. This is when the lenses in the eyes become clouded. Digestive problems. This may include peptic ulcers, heartburn, and gastroesophageal reflux disease (GERD). Oral health problems, such as gum disease, mouth sores, and tooth loss. Loss of taste and smell. Smoking also affects how you look and smell. Smoking may cause: Wrinkles. Yellow or stained teeth, fingers, and fingernails. Bad breath. Bad-smelling clothes and hair. Smoking tobacco can also affect your social life, because: It may be challenging to find places to smoke when away from home. Many workplaces, restaurants, hotels, and public places are tobacco-free. Smoking is expensive. This is due to the cost of tobacco and the long-term costs of treating health problems from smoking. Secondhand smoke may affect those around you. Secondhand smoke can cause lung cancer, breathing problems, and heart disease. Children of smokers have a higher risk for: Sudden infant death syndrome (SIDS). Ear infections. Lung infections. What  actions can I take to prevent health problems? Quit smoking  Do not start smoking. Quit if you already smoke. Do not replace cigarette smoking with vaping devices, such as e-cigarettes. Make a plan to quit smoking and commit to it. Look for programs to help you, and ask your health care provider for recommendations and ideas. Set a date and write down all the reasons you want to quit. Let your friends and family know you are quitting so they can help and support you. Consider finding friends who also want to quit. It can be easier to quit with someone else, so that you can support each other. Talk with your health care provider about using nicotine replacement medicines to help you quit. These include gum, lozenges, patches, sprays, or pills. If you try to quit but return to smoking, stay positive. It is common to slip up when you first quit, so take it one day at a time. Be prepared for cravings. When you feel the urge to smoke, chew gum or suck on hard candy. Lifestyle Stay busy. Take care of your body. Get plenty of exercise, eat a healthy diet, and drink plenty of water. Find ways to manage your stress, such as meditation, yoga, exercise, or time spent with friends and family. Ask your health care provider about having regular tests (screenings) to check for cancer. This may include blood tests, imaging tests, and other tests. Where to find support To get support to quit smoking, consider: Asking your health care provider for more information and resources. Joining a support group for people who want to quit smoking in your local community. There are many effective programs that may help you to quit. Calling the smokefree.gov counselor   helpline at 1-800-QUIT-NOW (1-800-784-8669). Where to find more information You may find more information about quitting smoking from: Centers for Disease Control and Prevention: cdc.gov/tobacco Smokefree.gov: smokefree.gov American Lung Association:  freedomfromsmoking.org Contact a health care provider if: You have problems breathing. Your lips, nose, or fingers turn blue. You have chest pain. You are coughing up blood. You feel like you will faint. You have other health changes that cause you to worry. Summary Smoking tobacco can negatively affect your health, the health of those around you, your finances, and your social life. Do not start smoking. Quit if you already smoke. If you need help quitting, ask your health care provider. Consider joining a support group for people in your local community who want to quit smoking. There are many effective programs that may help you to quit. This information is not intended to replace advice given to you by your health care provider. Make sure you discuss any questions you have with your health care provider. Document Revised: 01/20/2021 Document Reviewed: 01/20/2021 Elsevier Patient Education  2024 Elsevier Inc.  

## 2023-07-09 LAB — LIPID PANEL
Chol/HDL Ratio: 2 ratio (ref 0.0–4.4)
Cholesterol, Total: 136 mg/dL (ref 100–199)
HDL: 68 mg/dL (ref >39)
LDL Chol Calc (NIH): 55 mg/dL (ref 0–99)
Triglycerides: 59 mg/dL (ref 0–149)
VLDL Cholesterol Cal: 13 mg/dL (ref 5–40)

## 2023-07-09 LAB — CBC
Hematocrit: 43.2 % (ref 34.0–46.6)
Hemoglobin: 13.8 g/dL (ref 11.1–15.9)
MCH: 30.5 pg (ref 26.6–33.0)
MCHC: 31.9 g/dL (ref 31.5–35.7)
MCV: 96 fL (ref 79–97)
Platelets: 173 10*3/uL (ref 150–450)
RBC: 4.52 x10E6/uL (ref 3.77–5.28)
RDW: 12.5 % (ref 11.7–15.4)
WBC: 5.9 10*3/uL (ref 3.4–10.8)

## 2023-07-09 LAB — VITAMIN D 25 HYDROXY (VIT D DEFICIENCY, FRACTURES): Vit D, 25-Hydroxy: 9.3 ng/mL — ABNORMAL LOW (ref 30.0–100.0)

## 2023-07-09 LAB — CMP14+EGFR
ALT: 14 IU/L (ref 0–32)
AST: 23 IU/L (ref 0–40)
Albumin: 4.1 g/dL (ref 3.9–4.9)
Alkaline Phosphatase: 80 IU/L (ref 44–121)
BUN/Creatinine Ratio: 15 (ref 12–28)
BUN: 13 mg/dL (ref 8–27)
Bilirubin Total: 0.2 mg/dL (ref 0.0–1.2)
CO2: 22 mmol/L (ref 20–29)
Calcium: 9.2 mg/dL (ref 8.7–10.3)
Chloride: 109 mmol/L — ABNORMAL HIGH (ref 96–106)
Creatinine, Ser: 0.88 mg/dL (ref 0.57–1.00)
Globulin, Total: 2 g/dL (ref 1.5–4.5)
Glucose: 98 mg/dL (ref 70–99)
Potassium: 4.6 mmol/L (ref 3.5–5.2)
Sodium: 143 mmol/L (ref 134–144)
Total Protein: 6.1 g/dL (ref 6.0–8.5)
eGFR: 73 mL/min/{1.73_m2} (ref >59)

## 2023-07-09 LAB — TSH: TSH: 2.47 u[IU]/mL (ref 0.450–4.500)

## 2023-07-11 ENCOUNTER — Ambulatory Visit: Payer: Self-pay | Admitting: Family Medicine

## 2023-07-11 DIAGNOSIS — E559 Vitamin D deficiency, unspecified: Secondary | ICD-10-CM

## 2023-07-11 MED ORDER — VITAMIN D (ERGOCALCIFEROL) 1.25 MG (50000 UNIT) PO CAPS: 50000.0000 [IU] | ORAL_CAPSULE | ORAL | 0 refills | Status: AC

## 2023-08-03 ENCOUNTER — Encounter (INDEPENDENT_AMBULATORY_CARE_PROVIDER_SITE_OTHER): Payer: Self-pay | Admitting: *Deleted

## 2023-09-19 ENCOUNTER — Ambulatory Visit: Admitting: Dermatology

## 2023-10-03 ENCOUNTER — Other Ambulatory Visit

## 2023-10-04 ENCOUNTER — Other Ambulatory Visit

## 2023-10-04 DIAGNOSIS — E559 Vitamin D deficiency, unspecified: Secondary | ICD-10-CM

## 2023-10-05 ENCOUNTER — Ambulatory Visit: Payer: Self-pay | Admitting: Family Medicine

## 2023-10-05 LAB — VITAMIN D 25 HYDROXY (VIT D DEFICIENCY, FRACTURES): Vit D, 25-Hydroxy: 89.8 ng/mL (ref 30.0–100.0)

## 2023-11-02 ENCOUNTER — Encounter: Payer: 59 | Admitting: Family Medicine

## 2024-02-06 ENCOUNTER — Encounter (INDEPENDENT_AMBULATORY_CARE_PROVIDER_SITE_OTHER): Payer: Self-pay | Admitting: *Deleted

## 2024-02-14 ENCOUNTER — Encounter: Payer: Self-pay | Admitting: Family Medicine

## 2024-02-14 ENCOUNTER — Other Ambulatory Visit: Payer: Self-pay | Admitting: Family Medicine

## 2024-02-14 DIAGNOSIS — G43009 Migraine without aura, not intractable, without status migrainosus: Secondary | ICD-10-CM

## 2024-02-14 MED ORDER — ZONISAMIDE 100 MG PO CAPS: 200.0000 mg | ORAL_CAPSULE | Freq: Every day | ORAL | 1 refills | Status: AC

## 2024-02-14 NOTE — Progress Notes (Deleted)
 "  Subjective: CC:*** PCP: Jolinda Norene HERO, DO YEP:Susan Fischer is a 64 y.o. female presenting to clinic today for:  ***   ROS: Per HPI  Allergies[1] Past Medical History:  Diagnosis Date   Anorexia 12/11/2012   Arthritis    Coronary artery disease, non-occlusive 07/2016   65% d RCA (FFR 0.88), ~60% small mCx (~2 mm). normal EF.   Gastroparesis    Insomnia    Migraine    Osteopenia    Postmenopausal    Tobacco use disorder    Vitamin D  deficiency    Weight loss    Current Medications[2] Social History   Socioeconomic History   Marital status: Legally Separated    Spouse name: Not on file   Number of children: 6   Years of education: Not on file   Highest education level: Not on file  Occupational History   Occupation: custodian    Employer: [employer]  Tobacco Use   Smoking status: Every Day    Current packs/day: 0.50    Types: Cigarettes   Smokeless tobacco: Never   Tobacco comments:    form given 05-30-13  Vaping Use   Vaping status: Never Used  Substance and Sexual Activity   Alcohol use: No   Drug use: No   Sexual activity: Not on file  Other Topics Concern   Not on file  Social History Narrative   Not on file   Social Drivers of Health   Tobacco Use: High Risk (07/08/2023)   Patient History    Smoking Tobacco Use: Every Day    Smokeless Tobacco Use: Never    Passive Exposure: Not on file  Financial Resource Strain: Not on file  Food Insecurity: Not on file  Transportation Needs: Not on file  Physical Activity: Not on file  Stress: Not on file  Social Connections: Not on file  Intimate Partner Violence: Not on file  Depression (PHQ2-9): Low Risk (07/08/2023)   Depression (PHQ2-9)    PHQ-2 Score: 0  Alcohol Screen: Not on file  Housing: Not on file  Utilities: Not on file  Health Literacy: Not on file   Family History  Problem Relation Age of Onset   Diabetes Mother    Lung cancer Father    Heart attack Brother  83   Other Brother        Triple Bypass   Heart attack Brother    Breast cancer Paternal Grandmother    Skin cancer Maternal Grandmother    Diabetes Maternal Grandfather    Heart attack Sister    Other Sister        blocked arteries   Hypertension Son    Hypertension Son    Colon cancer Neg Hx    Throat cancer Neg Hx    Stomach cancer Neg Hx    Liver disease Neg Hx    Kidney disease Neg Hx     Objective: Office vital signs reviewed. There were no vitals taken for this visit.  Physical Examination:  General: Awake, alert, *** nourished, No acute distress HEENT: Normal    Neck: No masses palpated. No lymphadenopathy    Ears: Tympanic membranes intact, normal light reflex, no erythema, no bulging    Eyes: PERRLA, extraocular membranes intact, sclera ***    Nose: nasal turbinates moist, *** nasal discharge    Throat: moist mucus membranes, no erythema, *** tonsillar exudate.  Airway is patent Cardio: regular rate and rhythm, S1S2 heard, no murmurs appreciated Pulm:  clear to auscultation bilaterally, no wheezes, rhonchi or rales; normal work of breathing on room air GI: soft, non-tender, non-distended, bowel sounds present x4, no hepatomegaly, no splenomegaly, no masses GU: external vaginal tissue ***, cervix ***, *** punctate lesions on cervix appreciated, *** discharge from cervical os, *** bleeding, *** cervical motion tenderness, *** abdominal/ adnexal masses Extremities: warm, well perfused, No edema, cyanosis or clubbing; +*** pulses bilaterally MSK: *** gait and *** station Skin: dry; intact; no rashes or lesions Neuro: *** Strength and light touch sensation grossly intact, *** DTRs ***/4  Assessment/ Plan: 64 y.o. female   Osteopenia of neck of right femur  Vitamin D  deficiency   ***   Carmyn Hamm M Adamarys Shall, DO [facility] [phone]     [1]  Allergies Allergen Reactions   Codeine    Hydrocodone    Robitussin Cough-Cold D  [Pseudoephedrine-Dm-Gg]   [2]  Current Outpatient Medications:    albuterol  (VENTOLIN  HFA) 108 (90 Base) MCG/ACT inhaler, Inhale 2 puffs into the lungs every 6 (six) hours as needed for wheezing., Disp: 1 each, Rfl: 2   aspirin  EC 81 MG tablet, Take 81 mg by mouth daily., Disp: , Rfl:    atorvastatin  (LIPITOR) 20 MG tablet, Take 1 tablet (20 mg total) by mouth daily., Disp: 90 tablet, Rfl: 3   bisoprolol  (ZEBETA ) 5 MG tablet, Take 0.5 tablets (2.5 mg total) by mouth daily., Disp: 45 tablet, Rfl: 3   Vitamin D , Ergocalciferol , (DRISDOL ) 1.25 MG (50000 UNIT) CAPS capsule, Take 1 capsule (50,000 Units total) by mouth 2 (two) times a week. X12 weeks then recheck lab for refills, Disp: 24 capsule, Rfl: 0   zonisamide  (ZONEGRAN ) 100 MG capsule, Take 2 capsules (200 mg total) by mouth daily., Disp: 180 capsule, Rfl: 3  "

## 2024-05-10 ENCOUNTER — Telehealth

## 2024-07-11 ENCOUNTER — Ambulatory Visit
